# Patient Record
Sex: Male | Born: 1985
Health system: Southern US, Community
[De-identification: ages and names within clinical notes are randomized; demographics above are authoritative.]

## PROBLEM LIST (undated history)

## (undated) DIAGNOSIS — I10 Essential (primary) hypertension: Secondary | ICD-10-CM

## (undated) DIAGNOSIS — J45909 Unspecified asthma, uncomplicated: Secondary | ICD-10-CM

## (undated) HISTORY — DX: Unspecified asthma, uncomplicated: J45.909

## (undated) HISTORY — PX: KNEE ARTHROSCOPY: SUR90

## (undated) HISTORY — DX: Essential (primary) hypertension: I10

---
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
## 2013-10-22 DIAGNOSIS — I10 Essential (primary) hypertension: Secondary | ICD-10-CM | POA: Insufficient documentation

## 2015-02-10 DIAGNOSIS — J45909 Unspecified asthma, uncomplicated: Secondary | ICD-10-CM | POA: Insufficient documentation

## 2015-06-29 DIAGNOSIS — S83211A Bucket-handle tear of medial meniscus, current injury, right knee, initial encounter: Secondary | ICD-10-CM | POA: Insufficient documentation

## 2017-02-01 MED FILL — AMLODIPINE BESYLATE 5 MG TA: 5 | 90 days supply | Qty: 90 | Fill #0

## 2017-05-18 MED FILL — AMLODIPINE BESYLATE 5 MG TA: 5 | 90 days supply | Qty: 90 | Fill #1

## 2017-08-09 ENCOUNTER — Ambulatory Visit (INDEPENDENT_AMBULATORY_CARE_PROVIDER_SITE_OTHER): Payer: 59 | Admitting: Family Medicine

## 2017-08-09 ENCOUNTER — Encounter: Payer: Self-pay | Admitting: Family Medicine

## 2017-08-09 VITALS — BP 132/82 | HR 58 | Temp 98.6°F | Ht 66.0 in | Wt 189.2 lb

## 2017-08-09 DIAGNOSIS — I10 Essential (primary) hypertension: Secondary | ICD-10-CM

## 2017-08-09 DIAGNOSIS — Z13220 Encounter for screening for lipoid disorders: Secondary | ICD-10-CM

## 2017-08-09 DIAGNOSIS — Z114 Encounter for screening for human immunodeficiency virus [HIV]: Secondary | ICD-10-CM | POA: Diagnosis not present

## 2017-08-09 DIAGNOSIS — Z0001 Encounter for general adult medical examination with abnormal findings: Secondary | ICD-10-CM | POA: Diagnosis not present

## 2017-08-09 DIAGNOSIS — J4520 Mild intermittent asthma, uncomplicated: Secondary | ICD-10-CM | POA: Insufficient documentation

## 2017-08-09 DIAGNOSIS — Z1159 Encounter for screening for other viral diseases: Secondary | ICD-10-CM

## 2017-08-09 DIAGNOSIS — E669 Obesity, unspecified: Secondary | ICD-10-CM

## 2017-08-09 LAB — COMPREHENSIVE METABOLIC PANEL
ALT: 10 U/L (ref 0–53)
AST: 15 U/L (ref 0–37)
Albumin: 4.5 g/dL (ref 3.5–5.2)
Alkaline Phosphatase: 50 U/L (ref 39–117)
BUN: 13 mg/dL (ref 6–23)
CO2: 29 mEq/L (ref 19–32)
Calcium: 9.7 mg/dL (ref 8.4–10.5)
Chloride: 105 mEq/L (ref 96–112)
Creatinine, Ser: 0.98 mg/dL (ref 0.40–1.50)
GFR: 114.32 mL/min (ref >60.00)
Glucose, Bld: 93 mg/dL (ref 70–99)
Potassium: 4.1 mEq/L (ref 3.5–5.1)
Sodium: 139 mEq/L (ref 135–145)
Total Bilirubin: 0.6 mg/dL (ref 0.2–1.2)
Total Protein: 7.6 g/dL (ref 6.0–8.3)

## 2017-08-09 LAB — CBC
HCT: 44.9 % (ref 39.0–52.0)
Hemoglobin: 14.8 g/dL (ref 13.0–17.0)
MCHC: 32.9 g/dL (ref 30.0–36.0)
MCV: 86.7 fl (ref 78.0–100.0)
Platelets: 298 10*3/uL (ref 150.0–400.0)
RBC: 5.18 Mil/uL (ref 4.22–5.81)
RDW: 14.2 % (ref 11.5–15.5)
WBC: 2.7 10*3/uL — ABNORMAL LOW (ref 4.0–10.5)

## 2017-08-09 LAB — POCT GLYCOSYLATED HEMOGLOBIN (HGB A1C): Hemoglobin A1C: 5.3 % (ref 4.0–5.6)

## 2017-08-09 LAB — LIPID PANEL
Cholesterol: 159 mg/dL (ref 0–200)
HDL: 42.4 mg/dL (ref >39.00)
LDL Cholesterol: 107 mg/dL — ABNORMAL HIGH (ref 0–99)
NonHDL: 116.39
Total CHOL/HDL Ratio: 4
Triglycerides: 48 mg/dL (ref 0.0–149.0)
VLDL: 9.6 mg/dL (ref 0.0–40.0)

## 2017-08-09 MED ORDER — ALBUTEROL SULFATE HFA 108 (90 BASE) MCG/ACT IN AERS: 2.0000 | INHALATION_SPRAY | Freq: Four times a day (QID) | RESPIRATORY_TRACT | 0 refills | Status: DC | PRN

## 2017-08-09 MED ORDER — AMLODIPINE BESYLATE 5 MG PO TABS: 5.0000 mg | ORAL_TABLET | Freq: Every day | ORAL | 3 refills | Status: DC

## 2017-08-09 NOTE — Patient Instructions (Signed)
Preventive Care 18-39 Years, Male Preventive care refers to lifestyle choices and visits with your health care provider that can promote health and wellness. What does preventive care include?  A yearly physical exam. This is also called an annual well check.  Dental exams once or twice a year.  Routine eye exams. Ask your health care provider how often you should have your eyes checked.  Personal lifestyle choices, including: ? Daily care of your teeth and gums. ? Regular physical activity. ? Eating a healthy diet. ? Avoiding tobacco and drug use. ? Limiting alcohol use. ? Practicing safe sex. What happens during an annual well check? The services and screenings done by your health care provider during your annual well check will depend on your age, overall health, lifestyle risk factors, and family history of disease. Counseling Your health care provider may ask you questions about your:  Alcohol use.  Tobacco use.  Drug use.  Emotional well-being.  Home and relationship well-being.  Sexual activity.  Eating habits.  Work and work Statistician.  Screening You may have the following tests or measurements:  Height, weight, and BMI.  Blood pressure.  Lipid and cholesterol levels. These may be checked every 5 years starting at age 34.  Diabetes screening. This is done by checking your blood sugar (glucose) after you have not eaten for a while (fasting).  Skin check.  Hepatitis C blood test.  Hepatitis B blood test.  Sexually transmitted disease (STD) testing.  Discuss your test results, treatment options, and if necessary, the need for more tests with your health care provider. Vaccines Your health care provider may recommend certain vaccines, such as:  Influenza vaccine. This is recommended every year.  Tetanus, diphtheria, and acellular pertussis (Tdap, Td) vaccine. You may need a Td booster every 10 years.  Varicella vaccine. You may need this if you  have not been vaccinated.  HPV vaccine. If you are 23 or younger, you may need three doses over 6 months.  Measles, mumps, and rubella (MMR) vaccine. You may need at least one dose of MMR.You may also need a second dose.  Pneumococcal 13-valent conjugate (PCV13) vaccine. You may need this if you have certain conditions and have not been vaccinated.  Pneumococcal polysaccharide (PPSV23) vaccine. You may need one or two doses if you smoke cigarettes or if you have certain conditions.  Meningococcal vaccine. One dose is recommended if you are age 65-21 years and a first-year college student living in a residence hall, or if you have one of several medical conditions. You may also need additional booster doses.  Hepatitis A vaccine. You may need this if you have certain conditions or if you travel or work in places where you may be exposed to hepatitis A.  Hepatitis B vaccine. You may need this if you have certain conditions or if you travel or work in places where you may be exposed to hepatitis B.  Haemophilus influenzae type b (Hib) vaccine. You may need this if you have certain risk factors.  Talk to your health care provider about which screenings and vaccines you need and how often you need them. This information is not intended to replace advice given to you by your health care provider. Make sure you discuss any questions you have with your health care provider. Document Released: 03/29/2001 Document Revised: 10/21/2015 Document Reviewed: 12/02/2014 Elsevier Interactive Patient Education  [employer].

## 2017-08-09 NOTE — Assessment & Plan Note (Signed)
Stable. Sent in Rx for albuterol inhaler.

## 2017-08-09 NOTE — Assessment & Plan Note (Addendum)
Stable. Continue norvasc 5mg  daily.  Check CMET and CBC.

## 2017-08-09 NOTE — Progress Notes (Signed)
Subjective:  Max Weeks is a 32 y.o. male who presents today for his annual comprehensive physical exam.    HPI:  He has no acute complaints today.   Lifestyle Diet: No specific diets Exercise: Cardio and weights on off weeks.   Depression screen PHQ 2/9 08/09/2017  Decreased Interest 0  Down, Depressed, Hopeless 0  PHQ - 2 Score 0   Health Maintenance Due  Topic Date Due  . HIV Screening  01/18/2001  . TETANUS/TDAP  01/18/2005    ROS: Per HPI, otherwise a complete review of systems was negative.   PMH:  The following were reviewed and entered/updated in epic: Past Medical History:  Diagnosis Date  . Asthma   . Hypertension    Patient Active Problem List   Diagnosis Date Noted  . Essential hypertension 08/09/2017  . Mild intermittent asthma without complication 08/09/2017   Past Surgical History:  Procedure Laterality Date  . KNEE ARTHROSCOPY     meniscal tear - ju jitsu injury    Family History  Problem Relation Age of Onset  . Hypertension Mother   . Diabetes Maternal Grandmother   . Hypertension Maternal Grandmother   . Stroke Maternal Grandmother   . COPD Maternal Grandfather     Medications- reviewed and updated Current Outpatient Medications  Medication Sig Dispense Refill  . amLODipine (NORVASC) 5 MG tablet Take 1 tablet (5 mg total) by mouth daily. 90 tablet 3  . albuterol (PROVENTIL HFA;VENTOLIN HFA) 108 (90 Base) MCG/ACT inhaler Inhale 2 puffs into the lungs every 6 (six) hours as needed for wheezing or shortness of breath. 1 Inhaler 0   No current facility-administered medications for this visit.     Allergies-reviewed and updated No Known Allergies  Social History   Socioeconomic History  . Marital status: Single    Spouse name: Not on file  . Number of children: 0  . Years of education: Not on file  . Highest education level: Not on file  Occupational History  . Not on file  Social Needs  . Financial resource strain: Not on  file  . Food insecurity:    Worry: Not on file    Inability: Not on file  . Transportation needs:    Medical: Not on file    Non-medical: Not on file  Tobacco Use  . Smoking status: Never Smoker  . Smokeless tobacco: Never Used  Substance and Sexual Activity  . Alcohol use: Yes    Comment: Occasional  . Drug use: Never  . Sexual activity: Not on file  Lifestyle  . Physical activity:    Days per week: Not on file    Minutes per session: Not on file  . Stress: Not on file  Relationships  . Social connections:    Talks on phone: Not on file    Gets together: Not on file    Attends religious service: Not on file    Active member of club or organization: Not on file    Attends meetings of clubs or organizations: Not on file    Relationship status: Not on file  Other Topics Concern  . Not on file  Social History Narrative  . Not on file    Objective:  Physical Exam: BP 132/82 (BP Location: Left Arm, Patient Position: Sitting, Cuff Size: Normal)   Pulse (!) 58   Temp 98.6 F (37 C) (Oral)   Ht 5\' 6"  (1.676 m)   Wt 189 lb 3.2 oz (85.8 kg)  SpO2 97%   BMI 30.54 kg/m   Body mass index is 30.54 kg/m. Wt Readings from Last 3 Encounters:  08/09/17 189 lb 3.2 oz (85.8 kg)   Gen: NAD, resting comfortably HEENT: TMs normal bilaterally. OP clear. No thyromegaly noted.  CV: RRR with no murmurs appreciated Pulm: NWOB, CTAB with no crackles, wheezes, or rhonchi GI: Normal bowel sounds present. Soft, Nontender, Nondistended. MSK: no edema, cyanosis, or clubbing noted Skin: warm, dry Neuro: CN2-12 grossly intact. Strength 5/5 in upper and lower extremities. Reflexes symmetric and intact bilaterally.  Psych: Normal affect and thought content  Assessment/Plan:  Essential hypertension Stable. Continue norvasc 5mg  daily.  Check CMET and CBC.   Mild intermittent asthma without complication Stable. Sent in Rx for albuterol inhaler.   Preventative Healthcare: Check lipid  panel, screen for diabetes with A1c. Check HIV and hep C antibody.   Patient Counseling(The following topics were reviewed and/or handout was given):  -Nutrition: Stressed importance of moderation in sodium/caffeine intake, saturated fat and cholesterol, caloric balance, sufficient intake of fresh fruits, vegetables, and fiber.  -Stressed the importance of regular exercise.   -Substance Abuse: Discussed cessation/primary prevention of tobacco, alcohol, or other drug use; driving or other dangerous activities under the influence; availability of treatment for abuse.   -Injury prevention: Discussed safety belts, safety helmets, smoke detector, smoking near bedding or upholstery.   -Sexuality: Discussed sexually transmitted diseases, partner selection, use of condoms, avoidance of unintended pregnancy and contraceptive alternatives.   -Dental health: Discussed importance of regular tooth brushing, flossing, and dental visits.  -Health maintenance and immunizations reviewed. Please refer to Health maintenance section.  Return to care in 1 year for next preventative visit.   Katina Degree. Jimmey Ralph, MD 08/09/2017 9:45 AM

## 2017-08-10 LAB — HEPATITIS C ANTIBODY
Hepatitis C Ab: NONREACTIVE
SIGNAL TO CUT-OFF: 0.02 (ref <1.00)

## 2017-08-10 LAB — HIV ANTIBODY (ROUTINE TESTING W REFLEX): HIV 1&2 Ab, 4th Generation: NONREACTIVE

## 2017-08-10 NOTE — Progress Notes (Signed)
Dr Lavone NeriParker's interpretation of your lab work:  Your blood work was all normal. Keep up the good work!  If you have any additional questions, please give us a call or send us a message through [facility].  Take care, Dr Jimmey RalphParker

## 2017-09-07 MED FILL — AMLODIPINE BESYLATE 5 MG TA: 5 | 90 days supply | Qty: 90 | Fill #0

## 2017-09-07 MED FILL — VENTOLIN HFA 90 MCG INHALER: 108 (90 BAS | 25 days supply | Qty: 18 | Fill #0

## 2017-10-31 ENCOUNTER — Encounter: Payer: Self-pay | Admitting: Family Medicine

## 2017-11-02 ENCOUNTER — Ambulatory Visit (INDEPENDENT_AMBULATORY_CARE_PROVIDER_SITE_OTHER): Payer: 59

## 2017-11-02 DIAGNOSIS — Z23 Encounter for immunization: Secondary | ICD-10-CM | POA: Diagnosis not present

## 2017-11-02 NOTE — Progress Notes (Signed)
Per orders of Dr. Jimmey RalphParker , injection of HPV  given by Donnamarie PoagJoellen Y Thompson. Patient tolerated injection well. Patient given copy of VIS. Injection given in left deltoid. Will made app for HPV #2 in o1-2 months

## 2017-11-14 DIAGNOSIS — L905 Scar conditions and fibrosis of skin: Secondary | ICD-10-CM | POA: Diagnosis not present

## 2017-12-13 ENCOUNTER — Ambulatory Visit: Payer: 59

## 2017-12-18 ENCOUNTER — Ambulatory Visit: Payer: 59 | Admitting: Family Medicine

## 2017-12-18 ENCOUNTER — Other Ambulatory Visit (HOSPITAL_COMMUNITY)
Admission: RE | Admit: 2017-12-18 | Discharge: 2017-12-18 | Disposition: A | Discharge status: Home / Self Care | Payer: 59 | Source: Ambulatory Visit | Attending: Family Medicine | Admitting: Family Medicine

## 2017-12-18 ENCOUNTER — Encounter: Payer: Self-pay | Admitting: Family Medicine

## 2017-12-18 ENCOUNTER — Ambulatory Visit (INDEPENDENT_AMBULATORY_CARE_PROVIDER_SITE_OTHER): Payer: 59 | Admitting: Family Medicine

## 2017-12-18 VITALS — BP 130/8 | HR 71 | Temp 98.2°F | Ht 66.0 in | Wt 189.8 lb

## 2017-12-18 DIAGNOSIS — Z113 Encounter for screening for infections with a predominantly sexual mode of transmission: Secondary | ICD-10-CM | POA: Insufficient documentation

## 2017-12-18 DIAGNOSIS — J029 Acute pharyngitis, unspecified: Secondary | ICD-10-CM | POA: Diagnosis not present

## 2017-12-18 LAB — POCT RAPID STREP A (OFFICE): RAPID STREP A SCREEN: NEGATIVE

## 2017-12-18 MED ORDER — AMOXICILLIN 500 MG PO CAPS: 500.0000 mg | ORAL_CAPSULE | Freq: Two times a day (BID) | ORAL | 0 refills | Status: DC

## 2017-12-18 MED FILL — AMOXICILLIN 500 MG CAPSULE: 500 | 10 days supply | Qty: 20 | Fill #0

## 2017-12-18 NOTE — Progress Notes (Signed)
Patient: Max Weeks MRN: [id number] DOB: 05/31/1985 PCP: Ardith Dark, MD     Subjective:  Chief Complaint  Patient presents with  . Sore Throat  . right ear pressure    HPI: The patient is a 32 y.o. male who presents today for a sore throat and some right ear pain. He thinks his sore throat started a few weeks ago, but really has bothered him over the past couple of days. He at first had some congestion and then started to get a pain in his right ear. No fever/chills. No sinus/pain or pressure. No cough/shortness of breath. He has taken sudafed over the counter and claritin. No sick contacts that he can think of. 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   Also would like to be screened for STDs as he has had a new sexual partner.   Review of Systems  Constitutional: Negative for chills, fatigue and fever.  HENT: Positive for congestion, ear pain and sore throat. Negative for sinus pressure and sinus pain.        Right ear pressure  Respiratory: Negative for cough and shortness of breath.   Cardiovascular: Negative for chest pain.  Gastrointestinal: Negative for abdominal pain, nausea and vomiting.  Musculoskeletal: Negative for neck pain and neck stiffness.  Skin: Negative.   Neurological: Negative for dizziness and headaches.    Allergies Patient has No Known Allergies.  Past Medical History Patient  has a past medical history of Asthma and Hypertension.  Surgical History Patient  has a past surgical history that includes Knee arthroscopy.  Family History Pateint's family history includes COPD in his maternal grandfather; Diabetes in his maternal grandmother; Hypertension in his maternal grandmother and mother; Stroke in his maternal grandmother.  Social History Patient  reports that he has never smoked. He has never used smokeless tobacco. He reports that he drinks alcohol. He reports that he does not use drugs.    Objective: Vitals:   12/18/17 1340 12/18/17  1351  BP: 140/90 (!) 130/8  Pulse: 71   Temp: 98.2 F (36.8 C)   TempSrc: Oral   SpO2: 98%   Weight: 189 lb 12.8 oz (86.1 kg)   Height: 5\' 6"  (1.676 m)     Body mass index is 30.63 kg/m.  Physical Exam  Constitutional: He appears well-developed and well-nourished.  HENT:  Right Ear: Ear canal normal.  Left Ear: Tympanic membrane and ear canal normal.  Mouth/Throat: No oral lesions. Posterior oropharyngeal erythema present. Tonsils are 2+ on the right. Tonsils are 2+ on the left. Tonsillar exudate.  Left TM pearly with light reflex.  Right TM impacted with cerumen. Unable to lavage out.   Eyes: Pupils are equal, round, and reactive to light. EOM are normal.  Cardiovascular: Normal rate, regular rhythm and normal heart sounds.  Pulmonary/Chest: Effort normal and breath sounds normal.  Abdominal: Soft. Bowel sounds are normal.  Lymphadenopathy:    He has cervical adenopathy.  Neurological: He is alert.  Vitals reviewed.     rapid strep: negative   Assessment/plan: 1. Sore throat Tonsils very angry with exudate and look classic for strep. He has 3/4 centor criteria and will treat for strep. 10 day course of amoxicillin. Throw away toothbrush after 24 hours, no work x 24 hours. Ibuprofen, warm salt water gurgles. Let me know if not getting better. Will also culture for gc/c with new sexual partner.  - POCT rapid strep A - Cytology (oral, anal, urethral) ancillary only; Future  2. Screening  for STD (sexually transmitted disease)  - Urine cytology ancillary only; Future - HIV Antibody (routine testing w rflx) - RPR  -debrox over the counter to help with wax. Return prn for removal.    Return if symptoms worsen or fail to improve.    Orland Mustard, MD [facility]   12/18/2017

## 2017-12-19 LAB — CYTOLOGY, (ORAL, ANAL, URETHRAL) ANCILLARY ONLY
Chlamydia: NEGATIVE
Neisseria Gonorrhea: NEGATIVE

## 2017-12-19 LAB — URINE CYTOLOGY ANCILLARY ONLY
CHLAMYDIA, DNA PROBE: NEGATIVE
Neisseria Gonorrhea: NEGATIVE
Trichomonas: NEGATIVE

## 2017-12-19 LAB — HIV ANTIBODY (ROUTINE TESTING W REFLEX): HIV: NONREACTIVE

## 2017-12-19 LAB — RPR: RPR: NONREACTIVE

## 2018-01-01 ENCOUNTER — Other Ambulatory Visit (HOSPITAL_COMMUNITY)
Admission: RE | Admit: 2018-01-01 | Discharge: 2018-01-01 | Disposition: A | Discharge status: Home / Self Care | Payer: 59 | Source: Ambulatory Visit | Attending: Family Medicine | Admitting: Family Medicine

## 2018-01-01 ENCOUNTER — Ambulatory Visit (INDEPENDENT_AMBULATORY_CARE_PROVIDER_SITE_OTHER): Payer: 59 | Admitting: Family Medicine

## 2018-01-01 ENCOUNTER — Encounter: Payer: Self-pay | Admitting: Family Medicine

## 2018-01-01 VITALS — BP 122/72 | HR 62 | Temp 98.4°F | Ht 66.0 in | Wt 194.2 lb

## 2018-01-01 DIAGNOSIS — J029 Acute pharyngitis, unspecified: Secondary | ICD-10-CM | POA: Diagnosis not present

## 2018-01-01 NOTE — Progress Notes (Signed)
Patient: Max Weeks MRN: [id number] DOB: [birth year] PCP: Ardith Dark, MD     Subjective:  Chief Complaint  Patient presents with  . Sore Throat    HPI: The patient is a 32 y.o. male who presents today for sore throat. I saw him on 12/18/17 and treated for strep with 10 day course of amoxicillin. Cultures were negative. He states he got better and then this past Friday he started to feel really bad again. Symptoms improved Saturday and Sunday. 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 No exudate, but looks raw to him. He also has some post nasal drip and has been using flonase and claritin D for. He feels good today. Pain in his throat is still there. He has had no fevers or chills. He has no swollen lymph nodes and no known sick contacts, but does work in the hospital as an Administrator, arts.   He recalls possibly burning his throat with a hot latte right before he saw me on 12/18/2017.   Review of Systems  Constitutional: Negative for chills, fatigue and fever.  HENT: Positive for congestion, postnasal drip and sore throat. Negative for rhinorrhea.   Respiratory: Negative for cough and shortness of breath.   Cardiovascular: Negative for chest pain.  Gastrointestinal: Negative for abdominal pain and nausea.  Musculoskeletal: Negative for neck pain and neck stiffness.  Neurological: Negative for dizziness and headaches.    Allergies Patient has No Known Allergies.  Past Medical History Patient  has a past medical history of Asthma and Hypertension.  Surgical History Patient  has a past surgical history that includes Knee arthroscopy.  Family History Pateint's family history includes COPD in his maternal grandfather; Diabetes in his maternal grandmother; Hypertension in his maternal grandmother and mother; Stroke in his maternal grandmother.  Social History Patient  reports that he has never smoked. He has never used smokeless tobacco. He reports that he drinks alcohol. He  reports that he does not use drugs.    Objective: Vitals:   01/01/18 0812  BP: 122/72  Pulse: 62  Temp: 98.4 F (36.9 C)  TempSrc: Oral  SpO2: 98%  Weight: 194 lb 3.2 oz (88.1 kg)  Height: 5\' 6"  (1.676 m)    Body mass index is 31.34 kg/m.  Physical Exam  Constitutional: He appears well-developed and well-nourished.  HENT:  Right Ear: Tympanic membrane normal.  Left Ear: Tympanic membrane normal.  Mouth/Throat: Tonsils are 2+ on the right. Tonsils are 2+ on the left. No tonsillar exudate.  No tonsillar exudate, but right tonsil has what appears to be small tonsoliths, is enlarged and erythematous and injected. No vesicles or ulcerated lesions.   Neck: Normal range of motion. Neck supple.  Cardiovascular: Normal rate, regular rhythm and normal heart sounds.  Pulmonary/Chest: Effort normal and breath sounds normal.  Abdominal: Soft. Bowel sounds are normal.  Lymphadenopathy:    He has no cervical adenopathy.  Skin: Skin is warm and dry. No rash noted.  Vitals reviewed.      Assessment/plan: 1. Sore throat Already treated for tonsillitis/strep on 11/4. Will culture his throat today and he is requesting hsv swab. Will also check for candida. Continue to treat post nasal drip. If not better, we will send to ENT and he will let me know.  - Culture, Group A Strep - Cytology (oral, anal, urethral) ancillary only; Future - Cytology (oral, anal, urethral) ancillary only      Return if symptoms worsen or fail to  improve.   Orland MustardAllison Ladye Macnaughton, MD [facility]   01/01/2018

## 2018-01-02 ENCOUNTER — Telehealth: Payer: Self-pay | Admitting: Family Medicine

## 2018-01-02 LAB — CYTOLOGY, (ORAL, ANAL, URETHRAL) ANCILLARY ONLY: CANDIDA VAGINITIS: NEGATIVE

## 2018-01-02 NOTE — Telephone Encounter (Signed)
Copied from CRM [id number]. Topic: Quick Communication - See Telephone Encounter >> Jan 02, 2018 10:04 AM Windy KalataMichael, [name], NT wrote: CRM for notification. See Telephone encounter for: 01/02/18.  [name] is calling from Cytology and states that they can not perform a test that they have ordered for the swab. She would like a call back.  Cb# [phone]

## 2018-01-02 NOTE — Telephone Encounter (Signed)
[facility] [place] [facility] [facility].  Can not perform herpes culture off of swab provided.  [name] will check with Trey PaulaJeff in Microbiology to see if test can still be performed or if a new specimen needs to be submitted.  Trey PaulaJeff will contact me back tomorrow 11/20 with an answer.

## 2018-01-02 NOTE — Telephone Encounter (Signed)
Dr. Artis FlockWolfe saw this patient on 01/01/2018.

## 2018-01-03 LAB — CULTURE, GROUP A STREP
MICRO NUMBER: 91386116
SPECIMEN QUALITY:: ADEQUATE

## 2018-01-10 ENCOUNTER — Encounter: Payer: Self-pay | Admitting: Family Medicine

## 2018-01-15 MED FILL — AMLODIPINE BESYLATE 5 MG TA: 5 | 90 days supply | Qty: 90 | Fill #1

## 2018-05-03 MED FILL — AMLODIPINE BESYLATE 5 MG TA: 5 | 90 days supply | Qty: 90 | Fill #2

## 2018-05-07 ENCOUNTER — Other Ambulatory Visit: Payer: Self-pay | Admitting: Family Medicine

## 2018-05-07 MED ORDER — ALBUTEROL SULFATE HFA 108 (90 BASE) MCG/ACT IN AERS: 2.0000 | INHALATION_SPRAY | Freq: Four times a day (QID) | RESPIRATORY_TRACT | 0 refills | Status: DC | PRN

## 2018-05-08 MED FILL — ALBUTEROL SULFATE HFA 108 (: 108 (90 BAS | 25 days supply | Qty: 18 | Fill #0

## 2018-08-13 ENCOUNTER — Encounter: Payer: Self-pay | Admitting: Family Medicine

## 2018-08-13 ENCOUNTER — Other Ambulatory Visit: Payer: Self-pay

## 2018-08-13 ENCOUNTER — Other Ambulatory Visit (HOSPITAL_COMMUNITY)
Admission: RE | Admit: 2018-08-13 | Discharge: 2018-08-13 | Disposition: A | Discharge status: Home / Self Care | Payer: 59 | Source: Ambulatory Visit | Attending: Family Medicine | Admitting: Family Medicine

## 2018-08-13 ENCOUNTER — Ambulatory Visit (INDEPENDENT_AMBULATORY_CARE_PROVIDER_SITE_OTHER): Payer: 59 | Admitting: Family Medicine

## 2018-08-13 VITALS — BP 110/68 | HR 69 | Temp 98.6°F | Ht 66.0 in | Wt 183.4 lb

## 2018-08-13 DIAGNOSIS — I10 Essential (primary) hypertension: Secondary | ICD-10-CM | POA: Diagnosis not present

## 2018-08-13 DIAGNOSIS — Z0001 Encounter for general adult medical examination with abnormal findings: Secondary | ICD-10-CM | POA: Diagnosis not present

## 2018-08-13 DIAGNOSIS — Z0101 Encounter for examination of eyes and vision with abnormal findings: Secondary | ICD-10-CM | POA: Diagnosis not present

## 2018-08-13 DIAGNOSIS — J4520 Mild intermittent asthma, uncomplicated: Secondary | ICD-10-CM

## 2018-08-13 LAB — BASIC METABOLIC PANEL
BUN: 12 mg/dL (ref 6–23)
CO2: 27 mEq/L (ref 19–32)
Calcium: 9.5 mg/dL (ref 8.4–10.5)
Chloride: 104 mEq/L (ref 96–112)
Creatinine, Ser: 1.1 mg/dL (ref 0.40–1.50)
GFR: 93.53 mL/min (ref >60.00)
Glucose, Bld: 75 mg/dL (ref 70–99)
Potassium: 4 mEq/L (ref 3.5–5.1)
Sodium: 139 mEq/L (ref 135–145)

## 2018-08-13 LAB — LIPID PANEL
Cholesterol: 156 mg/dL (ref 0–200)
HDL: 44.6 mg/dL (ref >39.00)
LDL Cholesterol: 103 mg/dL — ABNORMAL HIGH (ref 0–99)
NonHDL: 111.37
Total CHOL/HDL Ratio: 3
Triglycerides: 42 mg/dL (ref 0.0–149.0)
VLDL: 8.4 mg/dL (ref 0.0–40.0)

## 2018-08-13 LAB — HEMOGLOBIN A1C: Hgb A1c MFr Bld: 5.4 % (ref 4.6–6.5)

## 2018-08-13 MED ORDER — AMLODIPINE BESYLATE 5 MG PO TABS: 5.0000 mg | ORAL_TABLET | Freq: Every day | ORAL | 3 refills | Status: DC

## 2018-08-13 MED FILL — AMLODIPINE BESYLATE 5 MG TA: 5 | 90 days supply | Qty: 90 | Fill #0

## 2018-08-13 NOTE — Assessment & Plan Note (Signed)
Stable. Continue albuterol as needed.  

## 2018-08-13 NOTE — Patient Instructions (Addendum)
It was very nice to see you today!  We will check blood work today and a urine sample.  I will refill your medications.    Eat at least 3 REAL meals and 1-2 snacks per day.  Aim for no more than 5 hours between eating.  Eat breakfast within one hour of getting up.    Obtain twice as many fruits/vegetables as protein or carbohydrate foods for both lunch and dinner.   Cut down on sweet beverages. This includes juice, soda, and sweet tea.    Exercise at least 150 minutes every week.   Take care, Dr Jerline Pain  Preventive Care 91-51 Years Old, Male Preventive care refers to lifestyle choices and visits with your health care provider that can promote health and wellness. This includes:  A yearly physical exam. This is also called an annual well check.  Regular dental and eye exams.  Immunizations.  Screening for certain conditions.  Healthy lifestyle choices, such as eating a healthy diet, getting regular exercise, not using drugs or products that contain nicotine and tobacco, and limiting alcohol use. What can I expect for my preventive care visit? Physical exam Your health care provider will check:  Height and weight. These may be used to calculate body mass index (BMI), which is a measurement that tells if you are at a healthy weight.  Heart rate and blood pressure.  Your skin for abnormal spots. Counseling Your health care provider may ask you questions about:  Alcohol, tobacco, and drug use.  Emotional well-being.  Home and relationship well-being.  Sexual activity.  Eating habits.  Work and work Statistician. What immunizations do I need?  Influenza (flu) vaccine  This is recommended every year. Tetanus, diphtheria, and pertussis (Tdap) vaccine  You may need a Td booster every 10 years. Varicella (chickenpox) vaccine  You may need this vaccine if you have not already been vaccinated. Human papillomavirus (HPV) vaccine  If recommended by your health  care provider, you may need three doses over 6 months. Measles, mumps, and rubella (MMR) vaccine  You may need at least one dose of MMR. You may also need a second dose. Meningococcal conjugate (MenACWY) vaccine  One dose is recommended if you are 29-75 years old and a Market researcher living in a residence hall, or if you have one of several medical conditions. You may also need additional booster doses. Pneumococcal conjugate (PCV13) vaccine  You may need this if you have certain conditions and were not previously vaccinated. Pneumococcal polysaccharide (PPSV23) vaccine  You may need one or two doses if you smoke cigarettes or if you have certain conditions. Hepatitis A vaccine  You may need this if you have certain conditions or if you travel or work in places where you may be exposed to hepatitis A. Hepatitis B vaccine  You may need this if you have certain conditions or if you travel or work in places where you may be exposed to hepatitis B. Haemophilus influenzae type b (Hib) vaccine  You may need this if you have certain risk factors. You may receive vaccines as individual doses or as more than one vaccine together in one shot (combination vaccines). Talk with your health care provider about the risks and benefits of combination vaccines. What tests do I need? Blood tests  Lipid and cholesterol levels. These may be checked every 5 years starting at age 74.  Hepatitis C test.  Hepatitis B test. Screening   Diabetes screening. This is done by checking  your blood sugar (glucose) after you have not eaten for a while (fasting).  Sexually transmitted disease (STD) testing. Talk with your health care provider about your test results, treatment options, and if necessary, the need for more tests. Follow these instructions at home: Eating and drinking   Eat a diet that includes fresh fruits and vegetables, whole grains, lean protein, and low-fat dairy products.   Take vitamin and mineral supplements as recommended by your health care provider.  Do not drink alcohol if your health care provider tells you not to drink.  If you drink alcohol: ? Limit how much you have to 0-2 drinks a day. ? Be aware of how much alcohol is in your drink. In the [place] one drink equals one 12 oz bottle of beer (355 mL), one 5 oz glass of wine (148 mL), or one 1 oz glass of hard liquor (44 mL). Lifestyle  Take daily care of your teeth and gums.  Stay active. Exercise for at least 30 minutes on 5 or more days each week.  Do not use any products that contain nicotine or tobacco, such as cigarettes, e-cigarettes, and chewing tobacco. If you need help quitting, ask your health care provider.  If you are sexually active, practice safe sex. Use a condom or other form of protection to prevent STIs (sexually transmitted infections). What's next?  Go to your health care provider once a year for a well check visit.  Ask your health care provider how often you should have your eyes and teeth checked.  Stay up to date on all vaccines. This information is not intended to replace advice given to you by your health care provider. Make sure you discuss any questions you have with your health care provider. Document Released: 03/29/2001 Document Revised: 01/25/2018 Document Reviewed: 01/25/2018 Elsevier Patient Education  2020 [employer]

## 2018-08-13 NOTE — Assessment & Plan Note (Signed)
At goal.  Continue amlodipine 5 mg daily. 

## 2018-08-13 NOTE — Progress Notes (Signed)
Chief Complaint:  Max Weeks is a 33 y.o. male who presents today for his annual comprehensive physical exam.    Assessment/Plan:  Mild intermittent asthma without complication Stable.  Continue albuterol as needed.  Essential hypertension At goal.  Continue amlodipine 5 mg daily.  BMI 29 Discussed lifestyle modifications.   Preventative Healthcare: Check lipid panel, A1c, HIV, RPR, hep C and urine cytology.   Patient Counseling(The following topics were reviewed and/or handout was given):  -Nutrition: Stressed importance of moderation in sodium/caffeine intake, saturated fat and cholesterol, caloric balance, sufficient intake of fresh fruits, vegetables, and fiber.  -Stressed the importance of regular exercise.   -Substance Abuse: Discussed cessation/primary prevention of tobacco, alcohol, or other drug use; driving or other dangerous activities under the influence; availability of treatment for abuse.   -Injury prevention: Discussed safety belts, safety helmets, smoke detector, smoking near bedding or upholstery.   -Sexuality: Discussed sexually transmitted diseases, partner selection, use of condoms, avoidance of unintended pregnancy and contraceptive alternatives.   -Dental health: Discussed importance of regular tooth brushing, flossing, and dental visits.  -Health maintenance and immunizations reviewed. Please refer to Health maintenance section.  Return to care in 1 year for next preventative visit.     Subjective:  HPI:  He has no acute complaints today.   His stable, chronic medical conditions are outlined below:  # Essential Hypertension - On norvasc 5mg  daily and tolerating well - Home BPs: 130s/80s - ROS: No reported chest pain or shortness of breath  # Asthma - Takes albuterol as needed  Lifestyle Diet: No specific diets. Working with [employer].  Exercise: Cardio and weights on off weeks.   Depression screen PHQ 2/9 08/13/2018  Decreased  Interest 0  Down, Depressed, Hopeless 0  PHQ - 2 Score 0   There are no preventive care reminders to display for this patient.   ROS: Per HPI, otherwise a complete review of systems was negative.   PMH:  The following were reviewed and entered/updated in epic: Past Medical History:  Diagnosis Date  . Asthma   . Hypertension    Patient Active Problem List   Diagnosis Date Noted  . Essential hypertension 08/09/2017  . Mild intermittent asthma without complication 26/83/4196   Past Surgical History:  Procedure Laterality Date  . KNEE ARTHROSCOPY     meniscal tear - ju jitsu injury    Family History  Problem Relation Age of Onset  . Hypertension Mother   . Diabetes Maternal Grandmother   . Hypertension Maternal Grandmother   . Stroke Maternal Grandmother   . COPD Maternal Grandfather     Medications- reviewed and updated Current Outpatient Medications  Medication Sig Dispense Refill  . albuterol (PROVENTIL HFA;VENTOLIN HFA) 108 (90 Base) MCG/ACT inhaler Inhale 2 puffs into the lungs every 6 (six) hours as needed for wheezing or shortness of breath. 1 Inhaler 0  . amLODipine (NORVASC) 5 MG tablet Take 1 tablet (5 mg total) by mouth daily. 90 tablet 3   No current facility-administered medications for this visit.     Allergies-reviewed and updated No Known Allergies  Social History   Socioeconomic History  . Marital status: Single    Spouse name: Not on file  . Number of children: 0  . Years of education: Not on file  . Highest education level: Not on file  Occupational History  . Not on file  Social Needs  . Financial resource strain: Not on file  . Food insecurity  Worry: Not on file    Inability: Not on file  . Transportation needs    Medical: Not on file    Non-medical: Not on file  Tobacco Use  . Smoking status: Never Smoker  . Smokeless tobacco: Never Used  Substance and Sexual Activity  . Alcohol use: Yes    Comment: Occasional  . Drug use:  Never  . Sexual activity: Not on file  Lifestyle  . Physical activity    Days per week: Not on file    Minutes per session: Not on file  . Stress: Not on file  Relationships  . Social Musicianconnections    Talks on phone: Not on file    Gets together: Not on file    Attends religious service: Not on file    Active member of club or organization: Not on file    Attends meetings of clubs or organizations: Not on file    Relationship status: Not on file  Other Topics Concern  . Not on file  Social History Narrative  . Not on file        Objective:  Physical Exam: BP 110/68 (BP Location: Left Arm, Patient Position: Sitting, Cuff Size: Normal)   Pulse 69   Temp 98.6 F (37 C) (Oral)   Ht 5\' 6"  (1.676 m)   Wt 183 lb 6.1 oz (83.2 kg)   SpO2 98%   BMI 29.60 kg/m   Body mass index is 29.6 kg/m. Wt Readings from Last 3 Encounters:  08/13/18 183 lb 6.1 oz (83.2 kg)  01/01/18 194 lb 3.2 oz (88.1 kg)  12/18/17 189 lb 12.8 oz (86.1 kg)   Gen: NAD, resting comfortably HEENT: TMs normal bilaterally. OP clear. No thyromegaly noted.  CV: RRR with no murmurs appreciated Pulm: NWOB, CTAB with no crackles, wheezes, or rhonchi GI: Normal bowel sounds present. Soft, Nontender, Nondistended. MSK: no edema, cyanosis, or clubbing noted Skin: warm, dry Neuro: CN2-12 grossly intact. Strength 5/5 in upper and lower extremities. Reflexes symmetric and intact bilaterally.  Psych: Normal affect and thought content     Max Weeks M. Jimmey RalphParker, MD 08/13/2018 9:54 AM

## 2018-08-14 LAB — URINE CYTOLOGY ANCILLARY ONLY
Chlamydia: NEGATIVE
Neisseria Gonorrhea: NEGATIVE
Trichomonas: NEGATIVE

## 2018-08-14 LAB — HEPATITIS C ANTIBODY
Hepatitis C Ab: NONREACTIVE
SIGNAL TO CUT-OFF: 0.02 (ref <1.00)

## 2018-08-14 LAB — HIV ANTIBODY (ROUTINE TESTING W REFLEX): HIV 1&2 Ab, 4th Generation: NONREACTIVE

## 2018-08-14 LAB — RPR: RPR Ser Ql: NONREACTIVE

## 2018-08-15 ENCOUNTER — Encounter: Payer: Self-pay | Admitting: Family Medicine

## 2018-08-15 NOTE — Progress Notes (Signed)
Dr Marigene Ehlers interpretation of your lab work:  LDL a bit better, everything else looks good!   If you have any additional questions, please give [place] a call or send [place] a message through [place].  Take care, Dr Jerline Pain

## 2018-09-07 MED FILL — AMLODIPINE BESYLATE 5 MG TA: 5 | 90 days supply | Qty: 90 | Fill #0

## 2018-10-10 ENCOUNTER — Encounter: Payer: Self-pay | Admitting: Family Medicine

## 2018-10-11 ENCOUNTER — Other Ambulatory Visit: Payer: Self-pay

## 2018-10-11 DIAGNOSIS — R5383 Other fatigue: Secondary | ICD-10-CM

## 2018-10-11 NOTE — Telephone Encounter (Signed)
Please advise about vitamin D level.

## 2018-10-18 ENCOUNTER — Other Ambulatory Visit: Payer: Self-pay

## 2018-10-18 ENCOUNTER — Other Ambulatory Visit (INDEPENDENT_AMBULATORY_CARE_PROVIDER_SITE_OTHER): Payer: 59

## 2018-10-18 ENCOUNTER — Ambulatory Visit (INDEPENDENT_AMBULATORY_CARE_PROVIDER_SITE_OTHER): Payer: 59

## 2018-10-18 DIAGNOSIS — R5383 Other fatigue: Secondary | ICD-10-CM

## 2018-10-18 DIAGNOSIS — Z23 Encounter for immunization: Secondary | ICD-10-CM

## 2018-10-18 LAB — VITAMIN D 25 HYDROXY (VIT D DEFICIENCY, FRACTURES): VITD: 31.49 ng/mL (ref 30.00–100.00)

## 2018-10-18 NOTE — Progress Notes (Signed)
Dr Marigene Ehlers interpretation of your lab work:  Vitamin D just above lower range of normal. May not be a bad idea to start a supplement during the winter months or I can send in a prescription for 50000IU weekly. Let us know which you prefer!   If you have any additional questions, please give [place] a call or send [place] a message through [place].  Take care, Dr Jerline Pain

## 2019-01-14 MED FILL — AMLODIPINE BESYLATE 5 MG TA: 5 | 90 days supply | Qty: 90 | Fill #1

## 2019-03-20 DIAGNOSIS — F4322 Adjustment disorder with anxiety: Secondary | ICD-10-CM | POA: Diagnosis not present

## 2019-03-26 DIAGNOSIS — F4322 Adjustment disorder with anxiety: Secondary | ICD-10-CM | POA: Diagnosis not present

## 2019-04-03 DIAGNOSIS — F4322 Adjustment disorder with anxiety: Secondary | ICD-10-CM | POA: Diagnosis not present

## 2019-04-09 DIAGNOSIS — F4322 Adjustment disorder with anxiety: Secondary | ICD-10-CM | POA: Diagnosis not present

## 2019-04-17 DIAGNOSIS — F4322 Adjustment disorder with anxiety: Secondary | ICD-10-CM | POA: Diagnosis not present

## 2019-04-23 DIAGNOSIS — F4322 Adjustment disorder with anxiety: Secondary | ICD-10-CM | POA: Diagnosis not present

## 2019-05-07 DIAGNOSIS — F4322 Adjustment disorder with anxiety: Secondary | ICD-10-CM | POA: Diagnosis not present

## 2019-05-29 DIAGNOSIS — F4322 Adjustment disorder with anxiety: Secondary | ICD-10-CM | POA: Diagnosis not present

## 2019-06-12 DIAGNOSIS — F4322 Adjustment disorder with anxiety: Secondary | ICD-10-CM | POA: Diagnosis not present

## 2019-06-12 MED FILL — AMLODIPINE BESYLATE 5 MG TA: 5 | 90 days supply | Qty: 90 | Fill #2

## 2019-06-18 DIAGNOSIS — F4322 Adjustment disorder with anxiety: Secondary | ICD-10-CM | POA: Diagnosis not present

## 2019-06-26 DIAGNOSIS — F4322 Adjustment disorder with anxiety: Secondary | ICD-10-CM | POA: Diagnosis not present

## 2019-07-12 DIAGNOSIS — F4322 Adjustment disorder with anxiety: Secondary | ICD-10-CM | POA: Diagnosis not present

## 2019-07-24 DIAGNOSIS — F4322 Adjustment disorder with anxiety: Secondary | ICD-10-CM | POA: Diagnosis not present

## 2019-07-29 DIAGNOSIS — F4322 Adjustment disorder with anxiety: Secondary | ICD-10-CM | POA: Diagnosis not present

## 2019-08-07 DIAGNOSIS — F4322 Adjustment disorder with anxiety: Secondary | ICD-10-CM | POA: Diagnosis not present

## 2019-08-12 DIAGNOSIS — F4322 Adjustment disorder with anxiety: Secondary | ICD-10-CM | POA: Diagnosis not present

## 2019-08-21 DIAGNOSIS — F4322 Adjustment disorder with anxiety: Secondary | ICD-10-CM | POA: Diagnosis not present

## 2019-09-04 DIAGNOSIS — F4322 Adjustment disorder with anxiety: Secondary | ICD-10-CM | POA: Diagnosis not present

## 2019-09-10 DIAGNOSIS — F4322 Adjustment disorder with anxiety: Secondary | ICD-10-CM | POA: Diagnosis not present

## 2019-09-18 ENCOUNTER — Other Ambulatory Visit: Payer: Self-pay

## 2019-09-18 ENCOUNTER — Encounter: Payer: Self-pay | Admitting: Family Medicine

## 2019-09-18 ENCOUNTER — Ambulatory Visit (INDEPENDENT_AMBULATORY_CARE_PROVIDER_SITE_OTHER): Payer: 59 | Admitting: Family Medicine

## 2019-09-18 ENCOUNTER — Other Ambulatory Visit (HOSPITAL_COMMUNITY)
Admission: RE | Admit: 2019-09-18 | Discharge: 2019-09-18 | Disposition: A | Discharge status: Home / Self Care | Payer: 59 | Source: Ambulatory Visit | Attending: Family Medicine | Admitting: Family Medicine

## 2019-09-18 VITALS — BP 111/73 | HR 59 | Temp 97.4°F | Ht 66.0 in | Wt 177.0 lb

## 2019-09-18 DIAGNOSIS — I10 Essential (primary) hypertension: Secondary | ICD-10-CM | POA: Diagnosis not present

## 2019-09-18 DIAGNOSIS — E7800 Pure hypercholesterolemia, unspecified: Secondary | ICD-10-CM

## 2019-09-18 DIAGNOSIS — Z0001 Encounter for general adult medical examination with abnormal findings: Secondary | ICD-10-CM | POA: Diagnosis not present

## 2019-09-18 DIAGNOSIS — Z23 Encounter for immunization: Secondary | ICD-10-CM | POA: Diagnosis not present

## 2019-09-18 DIAGNOSIS — J4520 Mild intermittent asthma, uncomplicated: Secondary | ICD-10-CM | POA: Diagnosis not present

## 2019-09-18 DIAGNOSIS — F4322 Adjustment disorder with anxiety: Secondary | ICD-10-CM | POA: Diagnosis not present

## 2019-09-18 DIAGNOSIS — Z113 Encounter for screening for infections with a predominantly sexual mode of transmission: Secondary | ICD-10-CM

## 2019-09-18 MED ORDER — AMLODIPINE BESYLATE 5 MG PO TABS: 5.0000 mg | ORAL_TABLET | Freq: Every day | ORAL | 3 refills | Status: DC

## 2019-09-18 MED ORDER — ALBUTEROL SULFATE HFA 108 (90 BASE) MCG/ACT IN AERS: 2.0000 | INHALATION_SPRAY | Freq: Four times a day (QID) | RESPIRATORY_TRACT | 0 refills | Status: DC | PRN

## 2019-09-18 MED FILL — ALBUTEROL SULFATE HFA 108 (: 108 (90 BAS | 25 days supply | Qty: 18 | Fill #0

## 2019-09-18 MED FILL — AMLODIPINE BESYLATE 5 MG TA: 5 | 90 days supply | Qty: 90 | Fill #0

## 2019-09-18 NOTE — Assessment & Plan Note (Signed)
Stable.  Continue amlodipine 5 mg daily.  Check CBC, CMET, TSH, lipid panel.

## 2019-09-18 NOTE — Patient Instructions (Signed)
It was very nice to see you today!  Keep up the good work!  We will check blood work and urine sample today.  We will give you your final dose of HPV today.  I will refill your medications.  Come ask me in a year for your next physical.  Come back to me sooner if needed.  Take care, Dr Jerline Pain  Please try these tips to maintain a healthy lifestyle:   Eat at least 3 REAL meals and 1-2 snacks per day.  Aim for no more than 5 hours between eating.  If you eat breakfast, please do so within one hour of getting up.    Each meal should contain half fruits/vegetables, one quarter protein, and one quarter carbs (no bigger than a computer mouse)   Cut down on sweet beverages. This includes juice, soda, and sweet tea.     Drink at least 1 glass of water with each meal and aim for at least 8 glasses per day   Exercise at least 150 minutes every week.    Preventive Care 47-42 Years Old, Male Preventive care refers to lifestyle choices and visits with your health care provider that can promote health and wellness. This includes:  A yearly physical exam. This is also called an annual well check.  Regular dental and eye exams.  Immunizations.  Screening for certain conditions.  Healthy lifestyle choices, such as eating a healthy diet, getting regular exercise, not using drugs or products that contain nicotine and tobacco, and limiting alcohol use. What can I expect for my preventive care visit? Physical exam Your health care provider will check:  Height and weight. These may be used to calculate body mass index (BMI), which is a measurement that tells if you are at a healthy weight.  Heart rate and blood pressure.  Your skin for abnormal spots. Counseling Your health care provider may ask you questions about:  Alcohol, tobacco, and drug use.  Emotional well-being.  Home and relationship well-being.  Sexual activity.  Eating habits.  Work and work Statistician. What  immunizations do I need?  Influenza (flu) vaccine  This is recommended every year. Tetanus, diphtheria, and pertussis (Tdap) vaccine  You may need a Td booster every 10 years. Varicella (chickenpox) vaccine  You may need this vaccine if you have not already been vaccinated. Human papillomavirus (HPV) vaccine  If recommended by your health care provider, you may need three doses over 6 months. Measles, mumps, and rubella (MMR) vaccine  You may need at least one dose of MMR. You may also need a second dose. Meningococcal conjugate (MenACWY) vaccine  One dose is recommended if you are 44-71 years old and a Market researcher living in a residence hall, or if you have one of several medical conditions. You may also need additional booster doses. Pneumococcal conjugate (PCV13) vaccine  You may need this if you have certain conditions and were not previously vaccinated. Pneumococcal polysaccharide (PPSV23) vaccine  You may need one or two doses if you smoke cigarettes or if you have certain conditions. Hepatitis A vaccine  You may need this if you have certain conditions or if you travel or work in places where you may be exposed to hepatitis A. Hepatitis B vaccine  You may need this if you have certain conditions or if you travel or work in places where you may be exposed to hepatitis B. Haemophilus influenzae type b (Hib) vaccine  You may need this if you have certain  risk factors. You may receive vaccines as individual doses or as more than one vaccine together in one shot (combination vaccines). Talk with your health care provider about the risks and benefits of combination vaccines. What tests do I need? Blood tests  Lipid and cholesterol levels. These may be checked every 5 years starting at age 22.  Hepatitis C test.  Hepatitis B test. Screening   Diabetes screening. This is done by checking your blood sugar (glucose) after you have not eaten for a while  (fasting).  Sexually transmitted disease (STD) testing. Talk with your health care provider about your test results, treatment options, and if necessary, the need for more tests. Follow these instructions at home: Eating and drinking   Eat a diet that includes fresh fruits and vegetables, whole grains, lean protein, and low-fat dairy products.  Take vitamin and mineral supplements as recommended by your health care provider.  Do not drink alcohol if your health care provider tells you not to drink.  If you drink alcohol: ? Limit how much you have to 0-2 drinks a day. ? Be aware of how much alcohol is in your drink. In the [place] one drink equals one 12 oz bottle of beer (355 mL), one 5 oz glass of wine (148 mL), or one 1 oz glass of hard liquor (44 mL). Lifestyle  Take daily care of your teeth and gums.  Stay active. Exercise for at least 30 minutes on 5 or more days each week.  Do not use any products that contain nicotine or tobacco, such as cigarettes, e-cigarettes, and chewing tobacco. If you need help quitting, ask your health care provider.  If you are sexually active, practice safe sex. Use a condom or other form of protection to prevent STIs (sexually transmitted infections). What's next?  Go to your health care provider once a year for a well check visit.  Ask your health care provider how often you should have your eyes and teeth checked.  Stay up to date on all vaccines. This information is not intended to replace advice given to you by your health care provider. Make sure you discuss any questions you have with your health care provider. Document Revised: 01/25/2018 Document Reviewed: 01/25/2018 Elsevier Patient Education  2020 [employer].

## 2019-09-18 NOTE — Progress Notes (Signed)
Chief Complaint:  Max Weeks is a 34 y.o. male who presents today for his annual comprehensive physical exam.    Assessment/Plan:  Chronic Problems Addressed Today: Mild intermittent asthma without complication Stable.  Will refill albuterol today.  Essential hypertension Stable.  Continue amlodipine 5 mg daily.  Check CBC, CMET, TSH, lipid panel.  Body mass index is 28.57 kg/m. / Overweight  BMI Metric Follow Up - 09/18/19 0930      BMI Metric Follow Up-Please document annually   BMI Metric Follow Up Education provided           Preventative Healthcare: Will give final HPV today.  Check CBC, CMET, TSH, lipid panel.  Will screen for STDs per request.  Patient Counseling(The following topics were reviewed and/or handout was given):  -Nutrition: Stressed importance of moderation in sodium/caffeine intake, saturated fat and cholesterol, caloric balance, sufficient intake of fresh fruits, vegetables, and fiber.  -Stressed the importance of regular exercise.   -Substance Abuse: Discussed cessation/primary prevention of tobacco, alcohol, or other drug use; driving or other dangerous activities under the influence; availability of treatment for abuse.   -Injury prevention: Discussed safety belts, safety helmets, smoke detector, smoking near bedding or upholstery.   -Sexuality: Discussed sexually transmitted diseases, partner selection, use of condoms, avoidance of unintended pregnancy and contraceptive alternatives.   -Dental health: Discussed importance of regular tooth brushing, flossing, and dental visits.  -Health maintenance and immunizations reviewed. Please refer to Health maintenance section.  Return to care in 1 year for next preventative visit.     Subjective:  HPI:  He has no acute complaints today.   Lifestyle Diet: Working with [employer]. Exercise: Working with Catering manager.   Depression screen [facility] 2/9 09/18/2019  Decreased Interest 0  Down, Depressed, Hopeless 0  PHQ  - 2 Score 0  Altered sleeping 0  Tired, decreased energy 0  Change in appetite 0  Feeling bad or failure about yourself  0  Trouble concentrating 0  Moving slowly or fidgety/restless 0  Suicidal thoughts 0  PHQ-9 Score 0    Health Maintenance Due  Topic Date Due  . TETANUS/TDAP  Never done     ROS: Per HPI, otherwise a complete review of systems was negative.   PMH:  The following were reviewed and entered/updated in epic: Past Medical History:  Diagnosis Date  . Asthma   . Hypertension    Patient Active Problem List   Diagnosis Date Noted  . Essential hypertension 08/09/2017  . Mild intermittent asthma without complication 08/09/2017   Past Surgical History:  Procedure Laterality Date  . KNEE ARTHROSCOPY     meniscal tear - ju jitsu injury    Family History  Problem Relation Age of Onset  . Hypertension Mother   . Diabetes Maternal Grandmother   . Hypertension Maternal Grandmother   . Stroke Maternal Grandmother   . COPD Maternal Grandfather     Medications- reviewed and updated Current Outpatient Medications  Medication Sig Dispense Refill  . albuterol (VENTOLIN HFA) 108 (90 Base) MCG/ACT inhaler Inhale 2 puffs into the lungs every 6 (six) hours as needed for wheezing or shortness of breath. 18 g 0  . amLODipine (NORVASC) 5 MG tablet Take 1 tablet (5 mg total) by mouth daily. 90 tablet 3   No current facility-administered medications for this visit.    Allergies-reviewed and updated No Known Allergies  Social History   Socioeconomic History  . Marital status: Single    Spouse name: Not on  file  . Number of children: 0  . Years of education: Not on file  . Highest education level: Not on file  Occupational History  . Not on file  Tobacco Use  . Smoking status: Never Smoker  . Smokeless tobacco: Never Used  Substance and Sexual Activity  . Alcohol use: Yes    Comment: Occasional  . Drug use: Never  . Sexual activity: Not on file  Other  Topics Concern  . Not on file  Social History Narrative  . Not on file   Social Determinants of Health   Financial Resource Strain:   . Difficulty of Paying Living Expenses:   Food Insecurity:   . Worried About Programme researcher, broadcasting/film/video in the Last Year:   . Barista in the Last Year:   Transportation Needs:   . Freight forwarder (Medical):   Marland Kitchen Lack of Transportation (Non-Medical):   Physical Activity:   . Days of Exercise per Week:   . Minutes of Exercise per Session:   Stress:   . Feeling of Stress :   Social Connections:   . Frequency of Communication with Friends and Family:   . Frequency of Social Gatherings with Friends and Family:   . Attends Religious Services:   . Active Member of Clubs or Organizations:   . Attends Banker Meetings:   Marland Kitchen Marital Status:         Objective:  Physical Exam: BP 111/73   Pulse (!) 59   Temp (!) 97.4 F (36.3 C)   Ht 5\' 6"  (1.676 m)   Wt 177 lb (80.3 kg)   SpO2 98%   BMI 28.57 kg/m   Body mass index is 28.57 kg/m. Wt Readings from Last 3 Encounters:  09/18/19 177 lb (80.3 kg)  08/13/18 183 lb 6.1 oz (83.2 kg)  01/01/18 194 lb 3.2 oz (88.1 kg)   Gen: NAD, resting comfortably HEENT: TMs normal bilaterally. OP clear. No thyromegaly noted.  CV: RRR with no murmurs appreciated Pulm: NWOB, CTAB with no crackles, wheezes, or rhonchi GI: Normal bowel sounds present. Soft, Nontender, Nondistended. MSK: no edema, cyanosis, or clubbing noted Skin: warm, dry Neuro: CN2-12 grossly intact. Strength 5/5 in upper and lower extremities. Reflexes symmetric and intact bilaterally.  Psych: Normal affect and thought content     Toris Laverdiere M. 01/03/18, MD 09/18/2019 9:31 AM

## 2019-09-18 NOTE — Assessment & Plan Note (Signed)
Stable.  Will refill albuterol today.

## 2019-09-18 NOTE — Addendum Note (Signed)
Addended by: Jimmye Norman on: 09/18/2019 02:17 PM   Modules accepted: Orders

## 2019-09-19 LAB — CBC
HCT: 43.7 % (ref 38.5–50.0)
Hemoglobin: 14.2 g/dL (ref 13.2–17.1)
MCH: 28.6 pg (ref 27.0–33.0)
MCHC: 32.5 g/dL (ref 32.0–36.0)
MCV: 87.9 fL (ref 80.0–100.0)
MPV: 10.8 fL (ref 7.5–12.5)
Platelets: 272 10*3/uL (ref 140–400)
RBC: 4.97 10*6/uL (ref 4.20–5.80)
RDW: 12.7 % (ref 11.0–15.0)
WBC: 2.8 10*3/uL — ABNORMAL LOW (ref 3.8–10.8)

## 2019-09-19 LAB — HEMOGLOBIN A1C
Hgb A1c MFr Bld: 5.4 % of total Hgb (ref <5.7)
Mean Plasma Glucose: 108 (calc)
eAG (mmol/L): 6 (calc)

## 2019-09-19 LAB — COMPREHENSIVE METABOLIC PANEL
AG Ratio: 1.4 (calc) (ref 1.0–2.5)
ALT: 8 U/L — ABNORMAL LOW (ref 9–46)
AST: 15 U/L (ref 10–40)
Albumin: 4.2 g/dL (ref 3.6–5.1)
Alkaline phosphatase (APISO): 52 U/L (ref 36–130)
BUN: 14 mg/dL (ref 7–25)
CO2: 27 mmol/L (ref 20–32)
Calcium: 9.4 mg/dL (ref 8.6–10.3)
Chloride: 104 mmol/L (ref 98–110)
Creat: 1.06 mg/dL (ref 0.60–1.35)
Globulin: 3.1 g/dL (calc) (ref 1.9–3.7)
Glucose, Bld: 109 mg/dL — ABNORMAL HIGH (ref 65–99)
Potassium: 4.1 mmol/L (ref 3.5–5.3)
Sodium: 138 mmol/L (ref 135–146)
Total Bilirubin: 0.5 mg/dL (ref 0.2–1.2)
Total Protein: 7.3 g/dL (ref 6.1–8.1)

## 2019-09-19 LAB — TEST AUTHORIZATION

## 2019-09-19 LAB — LIPID PANEL
Cholesterol: 148 mg/dL (ref <200)
HDL: 43 mg/dL (ref >=40)
LDL Cholesterol (Calc): 91 mg/dL (calc)
Non-HDL Cholesterol (Calc): 105 mg/dL (calc) (ref <130)
Total CHOL/HDL Ratio: 3.4 (calc) (ref <5.0)
Triglycerides: 58 mg/dL (ref <150)

## 2019-09-19 LAB — HIV ANTIBODY (ROUTINE TESTING W REFLEX): HIV 1&2 Ab, 4th Generation: NONREACTIVE

## 2019-09-19 LAB — VITAMIN D 25 HYDROXY (VIT D DEFICIENCY, FRACTURES): Vit D, 25-Hydroxy: 25 ng/mL — ABNORMAL LOW (ref 30–100)

## 2019-09-19 LAB — TSH: TSH: 1.86 mIU/L (ref 0.40–4.50)

## 2019-09-19 LAB — RPR: RPR Ser Ql: NONREACTIVE

## 2019-09-19 NOTE — Addendum Note (Signed)
Addended by: Laddie Aquas A on: 09/19/2019 03:57 PM   Modules accepted: Orders

## 2019-09-19 NOTE — Progress Notes (Signed)
Please inform patient of the following:  Vitamin D slightly low but everything else is NORMAL. He can take 2000-5000IU of vitamin D daily and we can recheck in a year or so.   Katina Degree. Jimmey Ralph, MD 09/19/2019 12:08 PM

## 2019-09-20 LAB — URINE CYTOLOGY ANCILLARY ONLY
Chlamydia: NEGATIVE
Comment: NEGATIVE
Comment: NEGATIVE
Comment: NORMAL
Neisseria Gonorrhea: NEGATIVE
Trichomonas: NEGATIVE

## 2019-10-02 DIAGNOSIS — F4322 Adjustment disorder with anxiety: Secondary | ICD-10-CM | POA: Diagnosis not present

## 2019-10-07 DIAGNOSIS — F4322 Adjustment disorder with anxiety: Secondary | ICD-10-CM | POA: Diagnosis not present

## 2019-10-16 DIAGNOSIS — F4322 Adjustment disorder with anxiety: Secondary | ICD-10-CM | POA: Diagnosis not present

## 2019-10-22 DIAGNOSIS — F4322 Adjustment disorder with anxiety: Secondary | ICD-10-CM | POA: Diagnosis not present

## 2019-11-05 DIAGNOSIS — F4322 Adjustment disorder with anxiety: Secondary | ICD-10-CM | POA: Diagnosis not present

## 2019-11-13 DIAGNOSIS — F4322 Adjustment disorder with anxiety: Secondary | ICD-10-CM | POA: Diagnosis not present

## 2019-11-13 MED FILL — AMLODIPINE BESYLATE 5 MG TA: 5 | 90 days supply | Qty: 90 | Fill #0

## 2019-11-13 MED FILL — ALBUTEROL SULFATE HFA 108 (: 108 (90 BAS | 25 days supply | Qty: 18 | Fill #0

## 2019-11-19 DIAGNOSIS — F4322 Adjustment disorder with anxiety: Secondary | ICD-10-CM | POA: Diagnosis not present

## 2019-11-29 MED FILL — AMLODIPINE BESYLATE 5 MG TA: 5 | 90 days supply | Qty: 90 | Fill #0

## 2019-11-29 MED FILL — ALBUTEROL SULFATE HFA 108 (: 108 (90 BAS | 25 days supply | Qty: 18 | Fill #0

## 2019-12-03 DIAGNOSIS — F4322 Adjustment disorder with anxiety: Secondary | ICD-10-CM | POA: Diagnosis not present

## 2019-12-11 DIAGNOSIS — F4322 Adjustment disorder with anxiety: Secondary | ICD-10-CM | POA: Diagnosis not present

## 2019-12-31 DIAGNOSIS — F4322 Adjustment disorder with anxiety: Secondary | ICD-10-CM | POA: Diagnosis not present

## 2020-01-22 DIAGNOSIS — F4322 Adjustment disorder with anxiety: Secondary | ICD-10-CM | POA: Diagnosis not present

## 2020-02-05 DIAGNOSIS — F4322 Adjustment disorder with anxiety: Secondary | ICD-10-CM | POA: Diagnosis not present

## 2020-02-24 DIAGNOSIS — F4322 Adjustment disorder with anxiety: Secondary | ICD-10-CM | POA: Diagnosis not present

## 2020-02-29 ENCOUNTER — Encounter: Payer: Self-pay | Admitting: Family Medicine

## 2020-03-04 DIAGNOSIS — F4322 Adjustment disorder with anxiety: Secondary | ICD-10-CM | POA: Diagnosis not present

## 2020-03-10 ENCOUNTER — Encounter: Payer: Self-pay | Admitting: Family Medicine

## 2020-03-10 ENCOUNTER — Ambulatory Visit: Payer: 59 | Admitting: Family Medicine

## 2020-03-10 ENCOUNTER — Other Ambulatory Visit: Payer: Self-pay

## 2020-03-10 DIAGNOSIS — G43809 Other migraine, not intractable, without status migrainosus: Secondary | ICD-10-CM | POA: Diagnosis not present

## 2020-03-10 DIAGNOSIS — G43909 Migraine, unspecified, not intractable, without status migrainosus: Secondary | ICD-10-CM | POA: Insufficient documentation

## 2020-03-10 DIAGNOSIS — R519 Headache, unspecified: Secondary | ICD-10-CM

## 2020-03-10 NOTE — Progress Notes (Signed)
   Max Weeks is a 35 y.o. male who presents today for an office visit.  Assessment/Plan:  Chronic Problems Addressed Today: Migraine No red flags. Reassuring neuro exam.  Likely new onset migraines.  Will check MRI to rule out other possible causes.  He can continue using over-the-counter meds as needed.  He will let me know if he needs prescription strength abortive medications or if symptoms change.  He will also get eye exam soon.     Subjective:  HPI:  Patient here with concern for migraines.  First had migraine about 4 months ago.  This was preceded by an aura described as blurry lines in his vision.  Symptoms subsided after few hours.  They again returned about 2 weeks ago.  He had 2 migraines in a week at this time.  Symptoms resolved with over-the-counter meds including Tylenol and ibuprofen.  He has not had any other neurologic symptoms since then.  No new weakness or numbness.  No other vision changes.  No history of migraines.  No current symptoms.       Objective:  Physical Exam: BP 131/79   Pulse 75   Temp 98.3 F (36.8 C) (Temporal)   Ht 5\' 6"  (1.676 m)   Wt 191 lb 9.6 oz (86.9 kg)   SpO2 97%   BMI 30.93 kg/m   Gen: No acute distress, resting comfortably CV: Regular rate and rhythm with no murmurs appreciated Pulm: Normal work of breathing, clear to auscultation bilaterally with no crackles, wheezes, or rhonchi Neuro: Cranial nerves II through XII intact.  Strength 5 out of 5 in upper and lower extremities.  Finger-nose-finger intact bilaterally. Psych: Normal affect and thought content      Quron Ruddy M. , MD 03/10/2020 2:42 PM

## 2020-03-10 NOTE — Patient Instructions (Signed)
It was very nice to see you today!  We will check an MRI.  Please let me know if your symptoms change or if you need [place] to send any medications to help with migraines.  Take care, Dr Jimmey Ralph  Please try these tips to maintain a healthy lifestyle:   Eat at least 3 REAL meals and 1-2 snacks per day.  Aim for no more than 5 hours between eating.  If you eat breakfast, please do so within one hour of getting up.    Each meal should contain half fruits/vegetables, one quarter protein, and one quarter carbs (no bigger than a computer mouse)   Cut down on sweet beverages. This includes juice, soda, and sweet tea.     Drink at least 1 glass of water with each meal and aim for at least 8 glasses per day   Exercise at least 150 minutes every week.

## 2020-03-10 NOTE — Assessment & Plan Note (Addendum)
No red flags. Reassuring neuro exam.  Likely new onset migraines.  Will check MRI to rule out other possible causes.  He can continue using over-the-counter meds as needed.  He will let me know if he needs prescription strength abortive medications or if symptoms change.  He will also get eye exam soon.

## 2020-03-18 DIAGNOSIS — F4322 Adjustment disorder with anxiety: Secondary | ICD-10-CM | POA: Diagnosis not present

## 2020-03-21 ENCOUNTER — Ambulatory Visit
Admission: RE | Admit: 2020-03-21 | Discharge: 2020-03-21 | Disposition: A | Discharge status: Home / Self Care | Payer: 59 | Source: Ambulatory Visit | Attending: Family Medicine | Admitting: Family Medicine

## 2020-03-21 ENCOUNTER — Other Ambulatory Visit: Payer: Self-pay

## 2020-03-21 DIAGNOSIS — R519 Headache, unspecified: Secondary | ICD-10-CM

## 2020-03-21 DIAGNOSIS — J341 Cyst and mucocele of nose and nasal sinus: Secondary | ICD-10-CM | POA: Diagnosis not present

## 2020-03-23 NOTE — Progress Notes (Signed)
Please inform patient of the following:  Good news! Essentially normal MRI with incidental mucosal thickening of the sinuses. Wouldn't hurt to try an allergy medication if he wants. Would like for him to me know if things change or worsen.

## 2020-03-24 ENCOUNTER — Other Ambulatory Visit: Payer: 59

## 2020-04-01 DIAGNOSIS — F4322 Adjustment disorder with anxiety: Secondary | ICD-10-CM | POA: Diagnosis not present

## 2020-04-15 DIAGNOSIS — F4322 Adjustment disorder with anxiety: Secondary | ICD-10-CM | POA: Diagnosis not present

## 2020-04-20 MED FILL — AMLODIPINE BESYLATE 5 MG TA: 5 | 90 days supply | Qty: 90 | Fill #1

## 2020-04-29 DIAGNOSIS — F4322 Adjustment disorder with anxiety: Secondary | ICD-10-CM | POA: Diagnosis not present

## 2020-05-13 DIAGNOSIS — F4322 Adjustment disorder with anxiety: Secondary | ICD-10-CM | POA: Diagnosis not present

## 2020-05-14 ENCOUNTER — Ambulatory Visit (INDEPENDENT_AMBULATORY_CARE_PROVIDER_SITE_OTHER): Payer: 59 | Admitting: Family Medicine

## 2020-05-14 ENCOUNTER — Ambulatory Visit: Payer: Self-pay

## 2020-05-14 ENCOUNTER — Encounter: Payer: Self-pay | Admitting: Family Medicine

## 2020-05-14 ENCOUNTER — Other Ambulatory Visit: Payer: Self-pay

## 2020-05-14 VITALS — BP 120/78 | HR 67 | Ht 66.0 in | Wt 196.2 lb

## 2020-05-14 DIAGNOSIS — M25512 Pain in left shoulder: Secondary | ICD-10-CM | POA: Diagnosis not present

## 2020-05-14 DIAGNOSIS — M25531 Pain in right wrist: Secondary | ICD-10-CM

## 2020-05-14 NOTE — Patient Instructions (Addendum)
Thank you for coming in today.  Please complete the exercises that the athletic trainer went over with you: View at [email or web address] using code: [email or web address]  I did order PT.  Schedule it out for 3 weeks ish. If better cancel and if not better go.   Recheck in about 6 weeks especially if not improving.   Keep me updated.

## 2020-05-14 NOTE — Progress Notes (Signed)
   I, Philbert Riser, LAT, ATC acting as a scribe for Clementeen Graham, MD.  Subjective:    CC: R wrist pain  HPI: Pt is a 35 y/o male c/o R wrist pain ongoing for 3 weeks. MOI: Pt has been going to the climbing gym a lot and using wrist to pull himself up. Pt locates pain to palmar and ulnar aspect of the distal forearm.   Swelling: no Grip strength: no change Numbness/tingling: no Aggravates: carrying items w/ grip Treatments tried: IBU, Voltaren gel  Pt also c/o L shoulder pain x 3 weeks. Pt reports discomfort when reaching above head (shoulder flexion). Pt locates pain to the anterior aspect of L shoulder.  Pertinent review of Systems: No fevers or chills  Relevant historical information: Hypertension, asthma.  Patient works as a Licensed conveyancer.   Objective:    Vitals:   05/14/20 1342  BP: 120/78  Pulse: 67  SpO2: 98%   General: Well Developed, well nourished, and in no acute distress.   MSK: Right wrist normal-appearing nontender normal motion normal strength.  Some pain with resisted ulnar deviation.  Minimal pain with Finkelstein's test.  Left shoulder normal-appearing Nontender. Normal motion. Strength 4+/5 to abduction. 5/5 internal and external rotation. Minimally positive Hawkins and Neer's test.  Minimally positive empty can test. Negative Yergason's and speeds test. Negative O'Brien's test. Negative crossover arm compression test.    Lab and Radiology Results  Diagnostic Limited MSK Ultrasound of: Right wrist and left shoulder Carpal tunnel normal-appearing flexor tendons and carpal tunnel structures. Dorsal wrist normal-appearing No joint effusion. Normal TFCC  Left shoulder biceps tendon normal-appearing Subscapularis normal. Supraspinatus tendon hypoechoic change within the mid substance tendon consistent with a intersubstance tear.  No retraction visible.  No significant vascular activity on Doppler. Infraspinatus tendon normal-appearing AC joint  normal-appearing  Impression: Right wrist normal-appearing Left shoulder possible supraspinatus intrasubstance tear without retraction.   Impression and Recommendations:    Assessment and Plan: 35 y.o. male with  Right wrist pain possible tendinopathy versus overuse.  Plan for home exercise program and referral to hand PT.  If not improving consider MRI arthrogram.  Left shoulder pain concerning for possible incomplete supraspinatus tear without retraction.  Plan for physical therapy and home exercise program.  Discussed nitroglycerin patch protocol patient would like to wait on that for now.  Recheck in about 6 weeks.  PDMP not reviewed this encounter. Orders Placed This Encounter  Procedures  . [place] LIMITED JOINT SPACE STRUCTURES UP LEFT(NO LINKED CHARGES)    Standing Status:   Future    Number of Occurrences:   1    Standing Expiration Date:   11/13/2020    Order Specific Question:   Reason for Exam (SYMPTOM  OR DIAGNOSIS REQUIRED)    Answer:   left shoulder pain    Order Specific Question:   Preferred imaging location?    Answer:   Adult [facility]  . Ambulatory referral to Physical Therapy    Referral Priority:   Routine    Referral Type:   Physical Medicine    Referral Reason:   Specialty Services Required    Requested Specialty:   Physical Therapy    Number of Visits Requested:   1   No orders of the defined types were placed in this encounter.   Discussed warning signs or symptoms. Please see discharge instructions. Patient expresses understanding.   The above documentation has been reviewed and is accurate and complete Clementeen Graham, M.D.

## 2020-05-18 ENCOUNTER — Ambulatory Visit: Payer: 59 | Admitting: Family Medicine

## 2020-05-18 ENCOUNTER — Encounter: Payer: Self-pay | Admitting: Family Medicine

## 2020-05-18 ENCOUNTER — Other Ambulatory Visit (HOSPITAL_COMMUNITY): Payer: Self-pay

## 2020-05-18 MED ORDER — NITROGLYCERIN 0.2 MG/HR TD PT24
MEDICATED_PATCH | TRANSDERMAL | 1 refills | Status: DC
  Filled 2020-05-18: qty 7, 28d supply, fill #0
  Filled 2020-06-15: qty 7, 28d supply, fill #1
  Filled 2020-07-17: qty 7, 28d supply, fill #2

## 2020-05-20 LAB — BASIC METABOLIC PANEL
BUN: 14 (ref 4–21)
CO2: 19 (ref 13–22)
Chloride: 105 (ref 99–108)
Creatinine: 1 (ref 0.6–1.3)
Glucose: 71
Potassium: 4.6 (ref 3.4–5.3)
Sodium: 140 (ref 137–147)

## 2020-05-20 LAB — CBC AND DIFFERENTIAL
HCT: 41 (ref 41–53)
Hemoglobin: 13.5 (ref 13.5–17.5)
Neutrophils Absolute: 1.3
Platelets: 306 (ref 150–399)
WBC: 3.9

## 2020-05-20 LAB — LIPID PANEL
Cholesterol: 152 (ref 0–200)
HDL: 46 (ref 35–70)
LDL Cholesterol: 15
LDl/HDL Ratio: 91
Triglycerides: 76 (ref 40–160)

## 2020-05-20 LAB — HEPATIC FUNCTION PANEL
ALT: 13 (ref 10–40)
AST: 22 (ref 14–40)
Alkaline Phosphatase: 77 (ref 25–125)
Bilirubin, Total: 0.3

## 2020-05-20 LAB — COMPREHENSIVE METABOLIC PANEL
Albumin: 4.2 (ref 3.5–5.0)
Calcium: 9.3 (ref 8.7–10.7)
GFR calc non Af Amer: 97
Globulin: 3.1

## 2020-05-20 LAB — CBC: RBC: 4.75 (ref 3.87–5.11)

## 2020-05-20 LAB — TSH: TSH: 1.92 (ref 0.41–5.90)

## 2020-05-20 LAB — HEMOGLOBIN A1C: Hemoglobin A1C: 5.6

## 2020-05-27 ENCOUNTER — Other Ambulatory Visit: Payer: Self-pay

## 2020-05-27 ENCOUNTER — Ambulatory Visit (INDEPENDENT_AMBULATORY_CARE_PROVIDER_SITE_OTHER): Payer: 59 | Admitting: Family Medicine

## 2020-05-27 VITALS — BP 135/73 | HR 69 | Ht 66.0 in | Wt 195.6 lb

## 2020-05-27 DIAGNOSIS — M25512 Pain in left shoulder: Secondary | ICD-10-CM

## 2020-05-27 DIAGNOSIS — F4322 Adjustment disorder with anxiety: Secondary | ICD-10-CM | POA: Diagnosis not present

## 2020-05-27 NOTE — Patient Instructions (Signed)
Thank you for coming in today.  Continue the home exercises and PT.   Let me know how it goes.

## 2020-05-27 NOTE — Progress Notes (Signed)
   I, Philbert Riser, LAT, ATC acting as a scribe for Clementeen Graham, MD.  Max Weeks is a 35 y.o. male who presents to [facility] at [facility] today for f/u of R wrist pain due to a recent increase in rock climbing and L shoulder pain.  He was last seen by Dr. Denyse Amass on 05/14/20 and was provided a HEP and referred to PT.  Since his last visit, pt reports wrist isn't bothering him. Pt reports L shoulder started bothering him more yesterday. Pt has been compliant w/ HEP. Pt developed some soreness after doing his HEP on Monday. Pt has been doing Nitro patches.   Patient is already started physical therapy at [facility] rehabilitation.     Pertinent review of systems: No fevers or chills  Relevant historical information: Hypertension   Exam:  BP 135/73 (BP Location: Right Arm, Patient Position: Sitting, Cuff Size: Normal)   Pulse 69   Ht 5\' 6"  (1.676 m)   Wt 195 lb 9.6 oz (88.7 kg)   SpO2 99%   BMI 31.57 kg/m  General: Well Developed, well nourished, and in no acute distress.   MSK: Left shoulder normal-appearing nontender normal motion. Mild pop felt with abduction range of motion. Intact strength. Minimally positive Hawkins and Neer's test. Negative Yergason's and speeds test. Minimally positive O'Brien's test. Negative clunk and relocation test.     Assessment and Plan: 35 y.o. male with left shoulder pain thought to be due to rotator cuff tendinopathy with possible interstitial rotator cuff tear.  Patient may have some labral pathology as well but this is less likely.  Already using nitroglycerin patch protocol.  We will proceed to physical therapy as already ordered.  If not improving in about 4 weeks consider MRI arthrogram of the left shoulder.  However patient I anticipate will do quite well.  Recheck as needed.    Discussed warning signs or symptoms. Please see discharge instructions. Patient expresses understanding.   The above documentation has  been reviewed and is accurate and complete 20, M.D.

## 2020-05-28 ENCOUNTER — Ambulatory Visit: Payer: 59 | Admitting: Family Medicine

## 2020-06-01 DIAGNOSIS — M25512 Pain in left shoulder: Secondary | ICD-10-CM | POA: Diagnosis not present

## 2020-06-10 DIAGNOSIS — F4322 Adjustment disorder with anxiety: Secondary | ICD-10-CM | POA: Diagnosis not present

## 2020-06-10 DIAGNOSIS — M25512 Pain in left shoulder: Secondary | ICD-10-CM | POA: Diagnosis not present

## 2020-06-15 ENCOUNTER — Other Ambulatory Visit (HOSPITAL_COMMUNITY): Payer: Self-pay

## 2020-06-15 DIAGNOSIS — M25512 Pain in left shoulder: Secondary | ICD-10-CM | POA: Diagnosis not present

## 2020-06-24 DIAGNOSIS — M25512 Pain in left shoulder: Secondary | ICD-10-CM | POA: Diagnosis not present

## 2020-06-24 NOTE — Progress Notes (Signed)
   I, [name], LAT, ATC, am serving as scribe for Dr. Clementeen Graham.  Max Weeks is a 35 y.o. male who presents to [facility] at [facility] today for f/u of L shoulder pain and R wrist pain.  He was last seen by Dr. Denyse Amass on 05/27/20 for R wrist pain f/u that had resolved and for his L shoulder.  He was doing his HEP consistently and using nitroglycerin patches.  He was previously referred to PT at [facility] and has completed 5 visits.  Since his last visit, pt reports shoulder pain is much improved, but will have times when it is "achy." Pt reports he R wrist hasn't been treating much. Pt notes only having wrist pain when pulling.    Pertinent review of systems: No fevers or chills  Relevant historical information: Hypertension   Exam:  BP 112/70 (BP Location: Right Arm, Patient Position: Sitting, Cuff Size: Normal)   Pulse (!) 59   Ht 5\' 6"  (1.676 m)   Wt 195 lb 12.8 oz (88.8 kg)   SpO2 97%   BMI 31.60 kg/m  General: Well Developed, well nourished, and in no acute distress.   MSK: Left shoulder normal-appearing Normal motion. Pain with abduction. Minimally positive Hawkins and Neer's test.  Mildly positive empty can test. Strength 4+/5 abduction.  5/5 external and internal rotation. Positive O'Brien test. Negative clunk and relocation test.  Right wrist normal-appearing Mildly tender palpation dorsal radial wrist. Normal wrist motion. Intact strength.    Lab and Radiology Results  X-ray images right wrist and left shoulder obtained today personally and independently interpreted  Left shoulder: No acute abnormalities.  No significant degenerative changes.  Right wrist: No acute abnormalities.  No significant degenerative changes.  Await formal radiology review     Assessment and Plan: 35 y.o. male with left shoulder pain.  Persistent but slightly improving with physical therapy.  Patient does have mechanical symptoms and does have  some concern for rotator cuff tendinopathy or labral tear.  Plan to finish out PT and proceed to home exercise program.  Advance activity as tolerated and if unable to resume normal level of activity next step would be MRI arthrogram.  He will let me know.  Right wrist pain lesser issue.  Watchful waiting and if not improving next step would be hand PT.   PDMP not reviewed this encounter. Orders Placed This Encounter  Procedures  . DG Shoulder Left    Standing Status:   Future    Standing Expiration Date:   06/25/2021    Order Specific Question:   Reason for Exam (SYMPTOM  OR DIAGNOSIS REQUIRED)    Answer:   eval shoudler pain    Order Specific Question:   Preferred imaging location?    Answer:   08/25/2021  . DG Wrist Complete Right    Standing Status:   Future    Standing Expiration Date:   06/25/2021    Order Specific Question:   Reason for Exam (SYMPTOM  OR DIAGNOSIS REQUIRED)    Answer:   eval wrist pain    Order Specific Question:   Preferred imaging location?    Answer:   08/25/2021   No orders of the defined types were placed in this encounter.    Discussed warning signs or symptoms. Please see discharge instructions. Patient expresses understanding.   The above documentation has been reviewed and is accurate and complete Kyra Searles, M.D.

## 2020-06-25 ENCOUNTER — Ambulatory Visit (INDEPENDENT_AMBULATORY_CARE_PROVIDER_SITE_OTHER): Payer: 59 | Admitting: Family Medicine

## 2020-06-25 ENCOUNTER — Other Ambulatory Visit: Payer: Self-pay

## 2020-06-25 ENCOUNTER — Ambulatory Visit (INDEPENDENT_AMBULATORY_CARE_PROVIDER_SITE_OTHER): Payer: 59

## 2020-06-25 VITALS — BP 112/70 | HR 59 | Ht 66.0 in | Wt 195.8 lb

## 2020-06-25 DIAGNOSIS — M25531 Pain in right wrist: Secondary | ICD-10-CM

## 2020-06-25 DIAGNOSIS — M25512 Pain in left shoulder: Secondary | ICD-10-CM

## 2020-06-25 NOTE — Patient Instructions (Signed)
Thank you for coming in today.  Please get an Xray today before you leave  Doreatha Martin out PT.   Resume and advance activity.   If the shoulder is not good enough next step is MRI arthrogram. Let me know.   Wrist: If not ok Hand PT.

## 2020-06-29 DIAGNOSIS — M25512 Pain in left shoulder: Secondary | ICD-10-CM | POA: Diagnosis not present

## 2020-06-29 NOTE — Progress Notes (Signed)
Right wrist x-ray looks normal to radiology

## 2020-06-29 NOTE — Progress Notes (Signed)
Left shoulder x-ray looks normal to radiology

## 2020-07-08 DIAGNOSIS — F4322 Adjustment disorder with anxiety: Secondary | ICD-10-CM | POA: Diagnosis not present

## 2020-07-08 DIAGNOSIS — M25512 Pain in left shoulder: Secondary | ICD-10-CM | POA: Diagnosis not present

## 2020-07-14 ENCOUNTER — Other Ambulatory Visit: Payer: Self-pay

## 2020-07-14 ENCOUNTER — Ambulatory Visit (INDEPENDENT_AMBULATORY_CARE_PROVIDER_SITE_OTHER): Payer: 59 | Admitting: Family Medicine

## 2020-07-14 ENCOUNTER — Ambulatory Visit: Payer: Self-pay

## 2020-07-14 VITALS — BP 122/80 | HR 57 | Ht 66.0 in | Wt 195.8 lb

## 2020-07-14 DIAGNOSIS — M79645 Pain in left finger(s): Secondary | ICD-10-CM

## 2020-07-14 NOTE — Progress Notes (Signed)
I, [name], LAT, ATC, am serving as scribe for Dr. Clementeen Graham.  Max Weeks is a 35 y.o. male who presents to [facility] at [facility] today for L 3rd trigger finger ongoing since Sunday, 07/12/20.  Pt has a hx of trigger finger on the R hand 15 years ago. He was last seen by Dr. Denyse Amass on 06/25/20 for L shoulder and R wrist pain f/u.  Since then, pt reports he has been wearing a splint. Pt demonstrates full finger ROM, but is afraid if he tries to grip something tightly it will lock. Pt locates locking to L 3rd MCP joint. Pt is R-hand dominate.  Swelling: slight Aggravating factors: gripping Treatments tried: splint, IBU, Voltaren gel   Pertinent review of systems: No fevers or chills  Relevant historical information: Hypertension   Exam:  BP 122/80 (BP Location: Right Arm, Patient Position: Sitting, Weeks Size: Normal)   Pulse (!) 57   Ht 5\' 6"  (1.676 m)   Wt 195 lb 12.8 oz (88.8 kg)   SpO2 99%   BMI 31.60 kg/m  General: Well Developed, well nourished, and in no acute distress.   MSK: Left hand normal-appearing with well-formed calluses from weightlifting. Normal hand motion and strength. No triggering palpated with flexion of PIP. Intact strength.    Lab and Radiology Results  Diagnostic Limited MSK Ultrasound of: Left third MCP palmar aspect Intact normal-appearing flexor tendon. Prominent ridge of bone at palmar MCP head radial aspect Impression: Abnormal prominent ridge of bone radial MCP      Assessment and Plan: 35 y.o. male with abnormal hand flexion intermittent problem left hand.  Patient had a similar episode right hand 15 years ago that resolved with conservative management.  This is not trigger finger.  Based on ultrasound appearance I think it is possible that Caldwell's flexor tendon subluxes over the prominent ridge at the palmar MCP when his hand is in a flexed position with a lot of force and then gets stuck in that position.   I was unable to reproduce this in clinic today but based on the anatomical appearance I think that is probably what is happening.  As this appears to be a very intermittent problem I think a bit of watchful waiting and conservative management is reasonable.  Plan for double Band-Aid splint across PIP to limit full flexion of the hand and watchful waiting.  Should this persistently recur developed a strategy to reduce the subluxed tendon and would recommend referral to both hand therapy and hand surgery for consultation and for hand therapy.  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  He will contact me as needed.   PDMP not reviewed this encounter. Orders Placed This Encounter  Procedures  . Elijah Birk LIMITED JOINT SPACE STRUCTURES UP LEFT(NO LINKED CHARGES)    Standing Status:   Future    Number of Occurrences:   1    Standing Expiration Date:   01/13/2021    Order Specific Question:   Reason for Exam (SYMPTOM  OR DIAGNOSIS REQUIRED)    Answer:   left finger pain    Order Specific Question:   Preferred imaging location?    Answer:   [facility]   No orders of the defined types were placed in this encounter.    Discussed warning signs or symptoms. Please see discharge instructions. Patient expresses understanding.   The above documentation has been reviewed and is accurate and complete 01/15/2021, M.D.

## 2020-07-14 NOTE — Patient Instructions (Signed)
Thank you for coming in today.  This is not the classic trigger finger.  I think the flexor tendon is popping (sublexing) over that ridge of bone in the hand.   Splint for a week or two.   If not better let me know. I can refer to hand surgery and hand therapy at the same time.

## 2020-07-17 ENCOUNTER — Other Ambulatory Visit (HOSPITAL_COMMUNITY): Payer: Self-pay

## 2020-07-22 DIAGNOSIS — F4322 Adjustment disorder with anxiety: Secondary | ICD-10-CM | POA: Diagnosis not present

## 2020-07-22 DIAGNOSIS — M25512 Pain in left shoulder: Secondary | ICD-10-CM | POA: Diagnosis not present

## 2020-08-05 DIAGNOSIS — M25512 Pain in left shoulder: Secondary | ICD-10-CM | POA: Diagnosis not present

## 2020-08-05 DIAGNOSIS — F4322 Adjustment disorder with anxiety: Secondary | ICD-10-CM | POA: Diagnosis not present

## 2020-08-19 DIAGNOSIS — F4322 Adjustment disorder with anxiety: Secondary | ICD-10-CM | POA: Diagnosis not present

## 2020-09-04 ENCOUNTER — Other Ambulatory Visit (HOSPITAL_COMMUNITY): Payer: Self-pay

## 2020-09-04 ENCOUNTER — Telehealth: Payer: 59 | Admitting: Emergency Medicine

## 2020-09-04 ENCOUNTER — Encounter: Payer: Self-pay | Admitting: Family Medicine

## 2020-09-04 DIAGNOSIS — U071 COVID-19: Secondary | ICD-10-CM | POA: Diagnosis not present

## 2020-09-04 MED ORDER — NIRMATRELVIR/RITONAVIR (PAXLOVID)TABLET
3.0000 | ORAL_TABLET | Freq: Two times a day (BID) | ORAL | 0 refills | Status: AC
  Filled 2020-09-04: qty 30, 5d supply, fill #0

## 2020-09-04 MED ORDER — ALBUTEROL SULFATE HFA 108 (90 BASE) MCG/ACT IN AERS
2.0000 | INHALATION_SPRAY | RESPIRATORY_TRACT | 3 refills | Status: DC | PRN
  Filled 2020-09-04: qty 18, 16d supply, fill #0

## 2020-09-04 MED ORDER — BENZONATATE 100 MG PO CAPS
100.0000 mg | ORAL_CAPSULE | Freq: Two times a day (BID) | ORAL | 0 refills | Status: DC | PRN
  Filled 2020-09-04: qty 20, 10d supply, fill #0

## 2020-09-04 MED FILL — Amlodipine Besylate Tab 5 MG (Base Equivalent): ORAL | 90 days supply | Qty: 90 | Fill #0 | Status: AC

## 2020-09-04 MED FILL — Amlodipine Besylate Tab 5 MG (Base Equivalent): ORAL | 90 days supply | Qty: 90 | Fill #0 | Status: CN

## 2020-09-04 NOTE — Progress Notes (Signed)
Virtual Visit Consent   Max Weeks, you are scheduled for a virtual visit with a [facility] provider today.     Just as with appointments in the office, your consent must be obtained to participate.  Your consent will be active for this visit and any virtual visit you may have with one of our providers in the next 365 days.     If you have a MyChart account, a copy of this consent can be sent to you electronically.  All virtual visits are billed to your insurance company just like a traditional visit in the office.    As this is a virtual visit, video technology does not allow for your provider to perform a traditional examination.  This may limit your provider's ability to fully assess your condition.  If your provider identifies any concerns that need to be evaluated in person or the need to arrange testing (such as labs, EKG, etc.), we will make arrangements to do so.     Although advances in technology are sophisticated, we cannot ensure that it will always work on either your end or our end.  If the connection with a video visit is poor, the visit may have to be switched to a telephone visit.  With either a video or telephone visit, we are not always able to ensure that we have a secure connection.     I need to obtain your verbal consent now.   Are you willing to proceed with your visit today?    Max Weeks has provided verbal consent on 09/04/2020 for a virtual visit video.   Max Weeks, Max Weeks   Date: 09/04/2020 2:10 PM   Virtual Visit via Video Note   I, Max Weeks, connected with  Max Weeks  ([id number], [birth year]) on 09/04/20 at  2:15 PM EDT by a video-enabled telemedicine application and verified that I am speaking with the correct person using two identifiers.  Location: Patient: Virtual Visit Location Patient: Home Provider: Virtual Visit Location Provider: Home Office   I discussed the limitations of evaluation and management by telemedicine and the  availability of in person appointments. The patient expressed understanding and agreed to proceed.    History of Present Illness: Max Weeks is a 35 y.o. who identifies as a male who was assigned male at birth, and is being seen today for COVID-19.  Has had a positive test today. Has hx of HTN and asthma.  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  Reports mild URI symptoms.  Interested in antiviral.  HPI: HPI  Problems:  Patient Active Problem List   Diagnosis Date Noted   Migraine 03/10/2020   Essential hypertension 08/09/2017   Mild intermittent asthma without complication 08/09/2017    Allergies: No Known Allergies Medications:  Current Outpatient Medications:    albuterol (VENTOLIN HFA) 108 (90 Base) MCG/ACT inhaler, INHALE 2 PUFFS INTO THE LUNGS EVERY 6 (SIX) HOURS AS NEEDED FOR WHEEZING OR SHORTNESS OF BREATH., Disp: 18 g, Rfl: 0   amLODipine (NORVASC) 5 MG tablet, TAKE 1 TABLET (5 MG TOTAL) BY MOUTH DAILY., Disp: 90 tablet, Rfl: 3   nitroGLYCERIN (NITRODUR - DOSED IN MG/24 HR) 0.2 mg/hr patch, Apply 1/4 patch daily to tendon for tendonitis., Disp: 30 patch, Rfl: 1  Observations/Objective: Patient is well-developed, well-nourished in no acute distress.  Resting comfortably at home.  Head is normocephalic, atraumatic.  No labored breathing. Speech is clear and coherent with logical content.  Patient is alert and oriented at  baseline.    Assessment and Plan: 1. COVID-19 - Paxlovid as directed - Albuterol inhaler q4hr prn -Tessalon 100mg  q12hr prn   Follow Up Instructions: I discussed the assessment and treatment plan with the patient. The patient was provided an opportunity to ask questions and all were answered. The patient agreed with the plan and demonstrated an understanding of the instructions.  A copy of instructions were sent to the patient via MyChart.  The patient was advised to call back or seek an in-person evaluation if the symptoms worsen or if the condition fails to  improve as anticipated.  Time:  I spent 13 minutes with the patient via telehealth technology discussing the above problems/concerns.    , Max Weeks

## 2020-09-04 NOTE — Patient Instructions (Signed)
Max Weeks, thank you for joining Max Horseman, PA-C for today's virtual visit.  While this provider is not your primary care provider (PCP), if your PCP is located in our provider database this encounter information will be shared with them immediately following your visit.  Consent: (Patient) Max Weeks provided verbal consent for this virtual visit at the beginning of the encounter.  Current Medications:  Current Outpatient Medications:    albuterol (VENTOLIN HFA) 108 (90 Base) MCG/ACT inhaler, Inhale 2 puffs into the lungs every 4 (four) hours as needed for wheezing or shortness of breath., Disp: 1 each, Rfl: 3   benzonatate (TESSALON) 100 MG capsule, Take 1 capsule (100 mg total) by mouth 2 (two) times daily as needed for cough., Disp: 20 capsule, Rfl: 0   nirmatrelvir/ritonavir EUA (PAXLOVID) TABS, Take 3 tablets by mouth 2 (two) times daily for 5 days. (Take nirmatrelvir 150 mg two tablets twice daily for 5 days and ritonavir 100 mg one tablet twice daily for 5 days) Patient GFR is >60, Disp: 30 tablet, Rfl: 0   amLODipine (NORVASC) 5 MG tablet, TAKE 1 TABLET (5 MG TOTAL) BY MOUTH DAILY., Disp: 90 tablet, Rfl: 3   nitroGLYCERIN (NITRODUR - DOSED IN MG/24 HR) 0.2 mg/hr patch, Apply 1/4 patch daily to tendon for tendonitis., Disp: 30 patch, Rfl: 1   Medications ordered in this encounter:  Meds ordered this encounter  Medications   nirmatrelvir/ritonavir EUA (PAXLOVID) TABS    Sig: Take 3 tablets by mouth 2 (two) times daily for 5 days. (Take nirmatrelvir 150 mg two tablets twice daily for 5 days and ritonavir 100 mg one tablet twice daily for 5 days) Patient GFR is >60    Dispense:  30 tablet    Refill:  0    Order Specific Question:   Supervising Provider    Answer:   Hyacinth Meeker, BRIAN [[id number]]   benzonatate (TESSALON) 100 MG capsule    Sig: Take 1 capsule (100 mg total) by mouth 2 (two) times daily as needed for cough.    Dispense:  20 capsule    Refill:  0    Order Specific  Question:   Supervising Provider    Answer:   MILLER, BRIAN [[id number]]   albuterol (VENTOLIN HFA) 108 (90 Base) MCG/ACT inhaler    Sig: Inhale 2 puffs into the lungs every 4 (four) hours as needed for wheezing or shortness of breath.    Dispense:  1 each    Refill:  3    Order Specific Question:   Supervising Provider    Answer:   Hyacinth Meeker, BRIAN [[id number]]     *If you need refills on other medications prior to your next appointment, please contact your pharmacy*  Follow-Up: Call back or seek an in-person evaluation if the symptoms worsen or if the condition fails to improve as anticipated.    If you have been instructed to have an in-person evaluation today at a local Urgent Care facility, please use the link below. It will take you to a list of all of our available [facility], including address, phone number and hours of operation. Please do not delay care.  [facility]  If you or a family member do not have a primary care provider, use the link below to schedule a visit and establish care. When you choose a [facility] primary care physician or advanced practice provider, you gain a long-term partner in health. Find a Primary Care Provider  Learn  more about [facility] in-office and virtual care options: [facility]

## 2020-09-16 DIAGNOSIS — F4322 Adjustment disorder with anxiety: Secondary | ICD-10-CM | POA: Diagnosis not present

## 2020-09-30 DIAGNOSIS — M79642 Pain in left hand: Secondary | ICD-10-CM | POA: Diagnosis not present

## 2020-10-15 DIAGNOSIS — M79642 Pain in left hand: Secondary | ICD-10-CM | POA: Diagnosis not present

## 2020-10-20 ENCOUNTER — Encounter: Payer: Self-pay | Admitting: Family Medicine

## 2020-10-20 ENCOUNTER — Ambulatory Visit (INDEPENDENT_AMBULATORY_CARE_PROVIDER_SITE_OTHER): Payer: 59 | Admitting: Family Medicine

## 2020-10-20 ENCOUNTER — Other Ambulatory Visit (HOSPITAL_COMMUNITY)
Admission: RE | Admit: 2020-10-20 | Discharge: 2020-10-20 | Disposition: A | Discharge status: Home / Self Care | Payer: 59 | Source: Ambulatory Visit | Attending: Family Medicine | Admitting: Family Medicine

## 2020-10-20 ENCOUNTER — Other Ambulatory Visit: Payer: Self-pay

## 2020-10-20 VITALS — BP 115/74 | HR 62 | Temp 98.6°F | Ht 66.0 in | Wt 196.0 lb

## 2020-10-20 DIAGNOSIS — Z0001 Encounter for general adult medical examination with abnormal findings: Secondary | ICD-10-CM | POA: Diagnosis not present

## 2020-10-20 DIAGNOSIS — Z113 Encounter for screening for infections with a predominantly sexual mode of transmission: Secondary | ICD-10-CM | POA: Insufficient documentation

## 2020-10-20 DIAGNOSIS — I10 Essential (primary) hypertension: Secondary | ICD-10-CM

## 2020-10-20 DIAGNOSIS — G43809 Other migraine, not intractable, without status migrainosus: Secondary | ICD-10-CM | POA: Diagnosis not present

## 2020-10-20 DIAGNOSIS — J4520 Mild intermittent asthma, uncomplicated: Secondary | ICD-10-CM

## 2020-10-20 NOTE — Progress Notes (Signed)
Chief Complaint:  Max Weeks is a 35 y.o. male who presents today for his annual comprehensive physical exam.    Assessment/Plan:  Chronic Problems Addressed Today: Migraine Overall stable.  No recent flares.  Mild intermittent asthma without complication On albuterol.  Doing well with this.  Essential hypertension At goal.  Continue amlodipine 5 mg daily.  Recently had labs done with doctors..  Please see his MyChart message.  Everything is within normal limits.  Preventative Healthcare: UTD on vaccines and labs.Will get blood work done today.  Will get STD checked.   Patient Counseling(The following topics were reviewed and/or handout was given):  -Nutrition: Stressed importance of moderation in sodium/caffeine intake, saturated fat and cholesterol, caloric balance, sufficient intake of fresh fruits, vegetables, and fiber.  -Stressed the importance of regular exercise.   -Substance Abuse: Discussed cessation/primary prevention of tobacco, alcohol, or other drug use; driving or other dangerous activities under the influence; availability of treatment for abuse.   -Injury prevention: Discussed safety belts, safety helmets, smoke detector, smoking near bedding or upholstery.   -Sexuality: Discussed sexually transmitted diseases, partner selection, use of condoms, avoidance of unintended pregnancy and contraceptive alternatives.   -Dental health: Discussed importance of regular tooth brushing, flossing, and dental visits.  -Health maintenance and immunizations reviewed. Please refer to Health maintenance section.  Return to care in 1 year for next preventative visit.     Subjective:  HPI:  He has no acute complaints today.   Lifestyle Diet: Balanced Exercise: Goes to gym regularly.   Depression screen PHQ 2/9 10/20/2020  Decreased Interest 0  Down, Depressed, Hopeless 0  PHQ - 2 Score 0  Altered sleeping -  Tired, decreased energy -  Change in appetite -  Feeling bad  or failure about yourself  -  Trouble concentrating -  Moving slowly or fidgety/restless -  Suicidal thoughts -  PHQ-9 Score -    Health Maintenance Due  Topic Date Due   TETANUS/TDAP  Never done   COVID-19 Vaccine (3 - Booster for Pfizer series) 07/22/2019     ROS: Per HPI, otherwise a complete review of systems was negative.   PMH:  The following were reviewed and entered/updated in epic: Past Medical History:  Diagnosis Date   Asthma    Hypertension    Patient Active Problem List   Diagnosis Date Noted   Migraine 03/10/2020   Essential hypertension 08/09/2017   Mild intermittent asthma without complication 08/09/2017   Past Surgical History:  Procedure Laterality Date   KNEE ARTHROSCOPY     meniscal tear - ju jitsu injury    Family History  Problem Relation Age of Onset   Hypertension Mother    Diabetes Maternal Grandmother    Hypertension Maternal Grandmother    Stroke Maternal Grandmother    COPD Maternal Grandfather     Medications- reviewed and updated Current Outpatient Medications  Medication Sig Dispense Refill   albuterol (VENTOLIN HFA) 108 (90 Base) MCG/ACT inhaler Inhale 2 puffs into the lungs every 4 (four) hours as needed for wheezing or shortness of breath. 18 g 3   amLODipine (NORVASC) 5 MG tablet TAKE 1 TABLET (5 MG TOTAL) BY MOUTH DAILY. 90 tablet 3   No current facility-administered medications for this visit.    Allergies-reviewed and updated No Known Allergies  Social History   Socioeconomic History   Marital status: Single    Spouse name: Not on file   Number of children: 0   Years of education:  Not on file   Highest education level: Not on file  Occupational History   Not on file  Tobacco Use   Smoking status: Never   Smokeless tobacco: Never  Substance and Sexual Activity   Alcohol use: Yes    Comment: Occasional   Drug use: Never   Sexual activity: Not on file  Other Topics Concern   Not on file  Social History  Narrative   Not on file   Social Determinants of Health   Financial Resource Strain: Not on file  Food Insecurity: Not on file  Transportation Needs: Not on file  Physical Activity: Not on file  Stress: Not on file  Social Connections: Not on file        Objective:  Physical Exam: BP 115/74   Pulse 62   Temp 98.6 F (37 C) (Temporal)   Ht 5\' 6"  (1.676 m)   Wt 196 lb (88.9 kg)   SpO2 96%   BMI 31.64 kg/m   Body mass index is 31.64 kg/m. Wt Readings from Last 3 Encounters:  10/20/20 196 lb (88.9 kg)  07/14/20 195 lb 12.8 oz (88.8 kg)  06/25/20 195 lb 12.8 oz (88.8 kg)   Gen: NAD, resting comfortably HEENT: TMs normal bilaterally. OP clear. No thyromegaly noted.  CV: RRR with no murmurs appreciated Pulm: NWOB, CTAB with no crackles, wheezes, or rhonchi GI: Normal bowel sounds present. Soft, Nontender, Nondistended. MSK: no edema, cyanosis, or clubbing noted Skin: warm, dry Neuro: CN2-12 grossly intact. Strength 5/5 in upper and lower extremities. Reflexes symmetric and intact bilaterally.  Psych: Normal affect and thought content      I,Savera Zaman,acting as a scribe for 08/25/20, MD.,have documented all relevant documentation on the behalf of Jacquiline Doe, MD,as directed by  Jacquiline Doe, MD while in the presence of Jacquiline Doe, MD.   I, Jacquiline Doe, MD, have reviewed all documentation for this visit. The documentation on 10/20/20 for the exam, diagnosis, procedures, and orders are all accurate and complete.  12/20/20. Katina Degree, MD 10/20/2020 2:25 PM

## 2020-10-20 NOTE — Assessment & Plan Note (Signed)
At goal.  Continue amlodipine 5 mg daily.  Recently had labs done with doctors..  Please see his MyChart message.  Everything is within normal limits.

## 2020-10-20 NOTE — Patient Instructions (Signed)
It was very nice to see you today!  We will check blood work and a urine sample.  No other changes today.  I will see you back in year for your next physical.  Come back to see me sooner if needed.  Take care, Dr Jimmey Ralph  PLEASE NOTE:  If you had any lab tests please let us know if you have not heard back within a few days. You may see your results on mychart before we have a chance to review them but we will give you a call once they are reviewed by [place]. If we ordered any referrals today, please let us know if you have not heard from their office within the next week.   Please try these tips to maintain a healthy lifestyle:  Eat at least 3 REAL meals and 1-2 snacks per day.  Aim for no more than 5 hours between eating.  If you eat breakfast, please do so within one hour of getting up.   Each meal should contain half fruits/vegetables, one quarter protein, and one quarter carbs (no bigger than a computer mouse)  Cut down on sweet beverages. This includes juice, soda, and sweet tea.   Drink at least 1 glass of water with each meal and aim for at least 8 glasses per day  Exercise at least 150 minutes every week.    Preventive Care 35-89 Years Old, Male Preventive care refers to lifestyle choices and visits with your health care provider that can promote health and wellness. This includes: A yearly physical exam. This is also called an annual wellness visit. Regular dental and eye exams. Immunizations. Screening for certain conditions. Healthy lifestyle choices, such as: Eating a healthy diet. Getting regular exercise. Not using drugs or products that contain nicotine and tobacco. Limiting alcohol use. What can I expect for my preventive care visit? Physical exam Your health care provider may check your: Height and weight. These may be used to calculate your BMI (body mass index). BMI is a measurement that tells if you are at a healthy weight. Heart rate and blood pressure. Body  temperature. Skin for abnormal spots. Counseling Your health care provider may ask you questions about your: Past medical problems. Family's medical history. Alcohol, tobacco, and drug use. Emotional well-being. Home life and relationship well-being. Sexual activity. Diet, exercise, and sleep habits. Work and work Astronomer. Access to firearms. What immunizations do I need? Vaccines are usually given at various ages, according to a schedule. Your health care provider will recommend vaccines for you based on your age, medical history, and lifestyle or other factors, such as travel or where you work. What tests do I need? Blood tests Lipid and cholesterol levels. These may be checked every 5 years starting at age 69. Hepatitis C test. Hepatitis B test. Screening  Diabetes screening. This is done by checking your blood sugar (glucose) after you have not eaten for a while (fasting). Genital exam to check for testicular cancer or hernias. STD (sexually transmitted disease) testing, if you are at risk. Talk with your health care provider about your test results, treatment options, and if necessary, the need for more tests. Follow these instructions at home: Eating and drinking  Eat a healthy diet that includes fresh fruits and vegetables, whole grains, lean protein, and low-fat dairy products. Drink enough fluid to keep your urine pale yellow. Take vitamin and mineral supplements as recommended by your health care provider. Do not drink alcohol if your health care provider tells  you not to drink. If you drink alcohol: Limit how much you have to 0-2 drinks a day. Be aware of how much alcohol is in your drink. In the [place] one drink equals one 12 oz bottle of beer (355 mL), one 5 oz glass of wine (148 mL), or one 1 oz glass of hard liquor (44 mL). Lifestyle Take daily care of your teeth and gums. Brush your teeth every morning and night with fluoride toothpaste. Floss one time each  day. Stay active. Exercise for at least 30 minutes 5 or more days each week. Do not use any products that contain nicotine or tobacco, such as cigarettes, e-cigarettes, and chewing tobacco. If you need help quitting, ask your health care provider. Do not use drugs. If you are sexually active, practice safe sex. Use a condom or other form of protection to prevent STIs (sexually transmitted infections). Find healthy ways to cope with stress, such as: Meditation, yoga, or listening to music. Journaling. Talking to a trusted person. Spending time with friends and family. Safety Always wear your seat belt while driving or riding in a vehicle. Do not drive: If you have been drinking alcohol. Do not ride with someone who has been drinking. When you are tired or distracted. While texting. Wear a helmet and other protective equipment during sports activities. If you have firearms in your house, make sure you follow all gun safety procedures. Seek help if you have been physically or sexually abused. What's next? Go to your health care provider once a year for an annual wellness visit. Ask your health care provider how often you should have your eyes and teeth checked. Stay up to date on all vaccines. This information is not intended to replace advice given to you by your health care provider. Make sure you discuss any questions you have with your health care provider. Document Revised: 04/10/2020 Document Reviewed: 01/25/2018 Elsevier Patient Education  2022 [employer].

## 2020-10-20 NOTE — Assessment & Plan Note (Signed)
On albuterol.  Doing well with this.

## 2020-10-20 NOTE — Assessment & Plan Note (Signed)
Overall stable. No recent flares.  

## 2020-10-21 ENCOUNTER — Encounter: Payer: 59 | Admitting: Family Medicine

## 2020-10-21 ENCOUNTER — Encounter: Payer: Self-pay | Admitting: Family Medicine

## 2020-10-21 LAB — RPR: RPR Ser Ql: NONREACTIVE

## 2020-10-21 LAB — HEPATITIS C ANTIBODY
Hepatitis C Ab: NONREACTIVE
SIGNAL TO CUT-OFF: 0.01 (ref <1.00)

## 2020-10-21 LAB — HIV ANTIBODY (ROUTINE TESTING W REFLEX): HIV 1&2 Ab, 4th Generation: NONREACTIVE

## 2020-10-22 LAB — URINE CYTOLOGY ANCILLARY ONLY
Chlamydia: NEGATIVE
Comment: NEGATIVE
Comment: NEGATIVE
Comment: NORMAL
Neisseria Gonorrhea: NEGATIVE
Trichomonas: NEGATIVE

## 2020-10-23 NOTE — Progress Notes (Signed)
Dr Lavone Neri interpretation of your lab work:  [employer]! Everything is NORMAL! We can check again next time you come in.    If you have any additional questions, please give [place] a call or send [place] a message through [place].  Take care, Dr Jimmey Ralph

## 2020-10-28 DIAGNOSIS — F4322 Adjustment disorder with anxiety: Secondary | ICD-10-CM | POA: Diagnosis not present

## 2020-11-16 DIAGNOSIS — F4322 Adjustment disorder with anxiety: Secondary | ICD-10-CM | POA: Diagnosis not present

## 2020-11-30 DIAGNOSIS — F4322 Adjustment disorder with anxiety: Secondary | ICD-10-CM | POA: Diagnosis not present

## 2020-12-23 DIAGNOSIS — F4322 Adjustment disorder with anxiety: Secondary | ICD-10-CM | POA: Diagnosis not present

## 2021-01-20 DIAGNOSIS — F4322 Adjustment disorder with anxiety: Secondary | ICD-10-CM | POA: Diagnosis not present

## 2021-01-21 ENCOUNTER — Other Ambulatory Visit (HOSPITAL_COMMUNITY): Payer: Self-pay

## 2021-01-21 ENCOUNTER — Other Ambulatory Visit: Payer: Self-pay | Admitting: Family Medicine

## 2021-01-21 MED ORDER — AMLODIPINE BESYLATE 5 MG PO TABS
ORAL_TABLET | Freq: Every day | ORAL | 3 refills | Status: DC
  Filled 2021-01-21: qty 90, 90d supply, fill #0
  Filled 2021-06-09: qty 90, 90d supply, fill #1

## 2021-03-03 DIAGNOSIS — F4322 Adjustment disorder with anxiety: Secondary | ICD-10-CM | POA: Diagnosis not present

## 2021-03-17 DIAGNOSIS — F4322 Adjustment disorder with anxiety: Secondary | ICD-10-CM | POA: Diagnosis not present

## 2021-04-14 DIAGNOSIS — F4322 Adjustment disorder with anxiety: Secondary | ICD-10-CM | POA: Diagnosis not present

## 2021-04-28 DIAGNOSIS — F4322 Adjustment disorder with anxiety: Secondary | ICD-10-CM | POA: Diagnosis not present

## 2021-05-17 ENCOUNTER — Encounter: Payer: Self-pay | Admitting: Family Medicine

## 2021-05-17 ENCOUNTER — Ambulatory Visit: Payer: 59 | Admitting: Family Medicine

## 2021-05-17 VITALS — BP 112/82 | HR 60 | Temp 98.1°F | Ht 66.0 in | Wt 208.0 lb

## 2021-05-17 DIAGNOSIS — R202 Paresthesia of skin: Secondary | ICD-10-CM | POA: Diagnosis not present

## 2021-05-17 NOTE — Patient Instructions (Signed)
Referral sent to [facility] ?

## 2021-05-17 NOTE — Progress Notes (Signed)
? ?  Subjective:  ? ? ? Patient ID: Max Weeks, male    DOB: [birth year], 36 y.o.   MRN: [id number] ? ?Chief Complaint  ?Patient presents with  ? Numbness  ?  Bilateral numbness in toes that started on Saturday ?  ? ? ?HPI ?Numbness toes.  Has been building a fence.  Woke up on 4/1 and toes numb B x great toes.  Intermitt intensity. Present when awakens in am.  Some relief when stands, but then returns.  R worse than L ?Yesterday, when put socks on(tight), and worse.  Some better when removed socks.  Has been on amlodipine long time.  Had been squatting down a lot while working on fence.   Has had some soreness lateral calves. No DM.  No edema.  Took ibu 2 days ago.  No back pain ? ?Hopitalist at [facility] ? ?Health Maintenance Due  ?Topic Date Due  ? TETANUS/TDAP  Never done  ? ? ?Past Medical History:  ?Diagnosis Date  ? Asthma   ? Hypertension   ? ? ?Past Surgical History:  ?Procedure Laterality Date  ? KNEE ARTHROSCOPY    ? meniscal tear - ju jitsu injury  ? ? ?Outpatient Medications Prior to Visit  ?Medication Sig Dispense Refill  ? albuterol (VENTOLIN HFA) 108 (90 Base) MCG/ACT inhaler Inhale 2 puffs into the lungs every 4 (four) hours as needed for wheezing or shortness of breath. 18 g 3  ? amLODipine (NORVASC) 5 MG tablet TAKE 1 TABLET (5 MG TOTAL) BY MOUTH DAILY. 90 tablet 3  ? ?No facility-administered medications prior to visit.  ? ? ?No Known Allergies ?ROS neg/noncontributory except as noted HPI/below ? ? ?   ?Objective:  ?  ? ?BP 112/82   Pulse 60   Temp 98.1 ?F (36.7 ?C) (Temporal)   Ht 5\' 6"  (1.676 m)   Wt 208 lb (94.3 kg)   SpO2 98%   BMI 33.57 kg/m?  ?Wt Readings from Last 3 Encounters:  ?05/17/21 208 lb (94.3 kg)  ?10/20/20 196 lb (88.9 kg)  ?07/14/20 195 lb 12.8 oz (88.8 kg)  ? ? ?Physical Exam  ? ?Gen: WDWN NAD ?HEENT: NCAT, conjunctiva not injected, sclera nonicteric ?EXT:  no edema ?MSK: no gross abnormalities.  No ttp feet/ankles/toes.  Good strength.  Good rom.  Dp 2+B.  No TTP back ?NEURO:  A&O x3.  CN II-XII intact.  ?PSYCH: normal mood. Good eye contact ? ?   ?Assessment & Plan:  ? ?Problem List Items Addressed This Visit   ?None ?Visit Diagnoses   ? ? Paresthesia    -  Primary  ? Relevant Orders  ? Ambulatory referral to Neurology  ? ?  ? ? Paresthesias feet after a lot of squatting buidling fence-suspect compressive neuropathy.   NSAID's.  Declines pred for now.  Will see Dr. Georgina Snell tomorrow at 8:45.  Requested referral to neuro(but he may cancel if better) ? ?No orders of the defined types were placed in this encounter. ? ? ?Wellington Hampshire, MD ? ?

## 2021-05-18 ENCOUNTER — Encounter: Payer: Self-pay | Admitting: Family Medicine

## 2021-05-18 ENCOUNTER — Encounter: Payer: Self-pay | Admitting: Neurology

## 2021-05-18 ENCOUNTER — Ambulatory Visit (INDEPENDENT_AMBULATORY_CARE_PROVIDER_SITE_OTHER): Payer: 59 | Admitting: Family Medicine

## 2021-05-18 VITALS — BP 102/78 | HR 60 | Ht 66.0 in | Wt 208.0 lb

## 2021-05-18 DIAGNOSIS — R202 Paresthesia of skin: Secondary | ICD-10-CM | POA: Diagnosis not present

## 2021-05-18 LAB — VITAMIN B12: Vitamin B-12: 322 pg/mL (ref 211–911)

## 2021-05-18 LAB — HEMOGLOBIN A1C: Hgb A1c MFr Bld: 5.9 % (ref 4.6–6.5)

## 2021-05-18 LAB — TSH: TSH: 2.72 u[IU]/mL (ref 0.35–5.50)

## 2021-05-18 LAB — SEDIMENTATION RATE: Sed Rate: 18 mm/hr — ABNORMAL HIGH (ref 0–15)

## 2021-05-18 NOTE — Patient Instructions (Addendum)
Good to see you today. ? ?Please get labs today before you leave. ?  ?Follow-up: as needed ?

## 2021-05-18 NOTE — Progress Notes (Signed)
? ?I, Max Weeks, LAT, ATC, am serving as scribe for Dr. Clementeen Graham. ? ?Max Weeks is a 36 y.o. male who presents to [facility] today for numbness in B toes, R>L, since 05/15/21.  He recalls having built a fence over the last month and was doing a lot of squatting.   He was seen by Primary Care on 05/17/21 w/ these c/o and was referred to neurology.  He locates his symptoms to mainly to his B 2nd toes, R>L.  His symptoms seem the worse in the morning.  He also reports B lower leg/calf tightness. ? ?Low back pain: no ?Swelling: no ?Aggravating factors: worse first thing in the morning ?Treatments tried: IBU ? ? ?Pertinent review of systems: no fever or chills. Positive for weight gain.  ? ?Relevant historical information: HTN ? ? ?Exam:  ?BP 102/78 (BP Location: Right Arm, Patient Position: Sitting, Cuff Size: Normal)   Pulse 60   Ht 5\' 6"  (1.676 m)   Wt 208 lb (94.3 kg)   SpO2 95%   BMI 33.57 kg/m?  ?General: Well Developed, well nourished, and in no acute distress.  ? ?MSK: L-spine: Nontender.  Normal motion. ?Lower extremity strength is intact. ?Sensation is intact to light touch. ?Reflexes are intact. ?Strength is intact. ? ?Feet bilaterally normal. ?Paresthesias minimally reproducible with metatarsal squeeze. ?Normal foot and ankle motion and strength. ? ? ? ? ?Assessment and Plan: ?36 y.o. male with bilateral toe paresthesias most dominant at toes 2-4 bilaterally. ?This occurred after 31 did a lot of loading on his forefeet while squatting to build a wooden fence over the last month.  I think the most likely explanation is that he has a compressive nerve irritation of the digital nerves at the metatarsal heads.  An atypical presentation of L5 lumbar radiculopathy bilaterally is also a possibility. ?Plan for a limited metabolic work-up listed below and will try metatarsal pads and changing his activity.  Recommend instead of squatting that he uses a stool or a  bench.  He is already started this intervention.  Plan for a bit of watchful waiting.  If not improving consider nerve conduction study and lumbar radiculopathy work-up.  He will let me know how it goes and we certainly can proceed with further evaluation into the future. ? ? ?PDMP not reviewed this encounter. ?Orders Placed This Encounter  ?Procedures  ? Vitamin B12  ?  Standing Status:   Future  ?  Number of Occurrences:   1  ?  Standing Expiration Date:   11/17/2021  ? TSH  ?  Standing Status:   Future  ?  Number of Occurrences:   1  ?  Standing Expiration Date:   11/17/2021  ? COMPLETE METABOLIC PANEL WITH GFR  ?  Standing Status:   Future  ?  Number of Occurrences:   1  ?  Standing Expiration Date:   11/17/2021  ? Sedimentation rate  ?  Standing Status:   Future  ?  Number of Occurrences:   1  ?  Standing Expiration Date:   11/17/2021  ? HgB A1c  ?  Standing Status:   Future  ?  Number of Occurrences:   1  ?  Standing Expiration Date:   05/19/2022  ? ?No orders of the defined types were placed in this encounter. ? ? ? ?Discussed warning signs or symptoms. Please see discharge instructions. Patient expresses understanding. ? ? ?The above documentation has been reviewed and  is accurate and complete Lynne Leader, M.D. ? ? ?

## 2021-05-19 LAB — COMPLETE METABOLIC PANEL WITH GFR
AG Ratio: 1.5 (calc) (ref 1.0–2.5)
ALT: 31 U/L (ref 9–46)
AST: 26 U/L (ref 10–40)
Albumin: 4.1 g/dL (ref 3.6–5.1)
Alkaline phosphatase (APISO): 61 U/L (ref 36–130)
BUN: 13 mg/dL (ref 7–25)
CO2: 24 mmol/L (ref 20–32)
Calcium: 8.8 mg/dL (ref 8.6–10.3)
Chloride: 106 mmol/L (ref 98–110)
Creat: 0.98 mg/dL (ref 0.60–1.26)
Globulin: 2.8 g/dL (calc) (ref 1.9–3.7)
Glucose, Bld: 106 mg/dL — ABNORMAL HIGH (ref 65–99)
Potassium: 4 mmol/L (ref 3.5–5.3)
Sodium: 139 mmol/L (ref 135–146)
Total Bilirubin: 0.4 mg/dL (ref 0.2–1.2)
Total Protein: 6.9 g/dL (ref 6.1–8.1)
eGFR: 103 mL/min/{1.73_m2} (ref >=60)

## 2021-05-19 NOTE — Progress Notes (Signed)
Sed rate is minimally elevated.  Blood sugar is minimally elevated but I do not think you are fasting for these labs.  B12 is normal.

## 2021-05-24 ENCOUNTER — Other Ambulatory Visit (HOSPITAL_COMMUNITY): Payer: Self-pay

## 2021-05-24 ENCOUNTER — Encounter: Payer: Self-pay | Admitting: Family Medicine

## 2021-05-24 MED ORDER — METHYLPREDNISOLONE 4 MG PO TBPK
ORAL_TABLET | ORAL | 0 refills | Status: DC
  Filled 2021-05-24: qty 21, 6d supply, fill #0

## 2021-05-26 DIAGNOSIS — F4322 Adjustment disorder with anxiety: Secondary | ICD-10-CM | POA: Diagnosis not present

## 2021-06-01 ENCOUNTER — Other Ambulatory Visit: Payer: Self-pay | Admitting: Student

## 2021-06-01 ENCOUNTER — Other Ambulatory Visit (HOSPITAL_COMMUNITY): Payer: Self-pay | Admitting: Student

## 2021-06-01 DIAGNOSIS — M546 Pain in thoracic spine: Secondary | ICD-10-CM | POA: Diagnosis not present

## 2021-06-01 DIAGNOSIS — R200 Anesthesia of skin: Secondary | ICD-10-CM | POA: Diagnosis not present

## 2021-06-01 DIAGNOSIS — M545 Low back pain, unspecified: Secondary | ICD-10-CM | POA: Diagnosis not present

## 2021-06-01 DIAGNOSIS — R202 Paresthesia of skin: Secondary | ICD-10-CM | POA: Diagnosis not present

## 2021-06-01 DIAGNOSIS — M549 Dorsalgia, unspecified: Secondary | ICD-10-CM | POA: Diagnosis not present

## 2021-06-01 DIAGNOSIS — M419 Scoliosis, unspecified: Secondary | ICD-10-CM | POA: Diagnosis not present

## 2021-06-03 ENCOUNTER — Other Ambulatory Visit (HOSPITAL_COMMUNITY): Payer: Self-pay

## 2021-06-03 MED ORDER — CYCLOBENZAPRINE HCL 5 MG PO TABS
ORAL_TABLET | ORAL | 1 refills | Status: DC
  Filled 2021-06-03: qty 60, 20d supply, fill #0

## 2021-06-07 ENCOUNTER — Encounter (HOSPITAL_COMMUNITY): Payer: Self-pay

## 2021-06-07 ENCOUNTER — Other Ambulatory Visit (HOSPITAL_COMMUNITY): Payer: 59

## 2021-06-09 ENCOUNTER — Other Ambulatory Visit (HOSPITAL_COMMUNITY): Payer: Self-pay

## 2021-06-09 DIAGNOSIS — M549 Dorsalgia, unspecified: Secondary | ICD-10-CM | POA: Diagnosis not present

## 2021-06-15 DIAGNOSIS — M549 Dorsalgia, unspecified: Secondary | ICD-10-CM | POA: Diagnosis not present

## 2021-06-23 DIAGNOSIS — M549 Dorsalgia, unspecified: Secondary | ICD-10-CM | POA: Diagnosis not present

## 2021-06-23 DIAGNOSIS — F4322 Adjustment disorder with anxiety: Secondary | ICD-10-CM | POA: Diagnosis not present

## 2021-06-29 DIAGNOSIS — M549 Dorsalgia, unspecified: Secondary | ICD-10-CM | POA: Diagnosis not present

## 2021-07-13 DIAGNOSIS — M549 Dorsalgia, unspecified: Secondary | ICD-10-CM | POA: Diagnosis not present

## 2021-07-21 DIAGNOSIS — F4322 Adjustment disorder with anxiety: Secondary | ICD-10-CM | POA: Diagnosis not present

## 2021-08-11 IMAGING — DX DG WRIST COMPLETE 3+V*R*
4 series · 4 of 4 positions shown · non-contrast
Comparison: None.

CLINICAL DATA: Chronic pain

EXAM:
RIGHT WRIST - COMPLETE 3+ VIEW

[wrist ap]
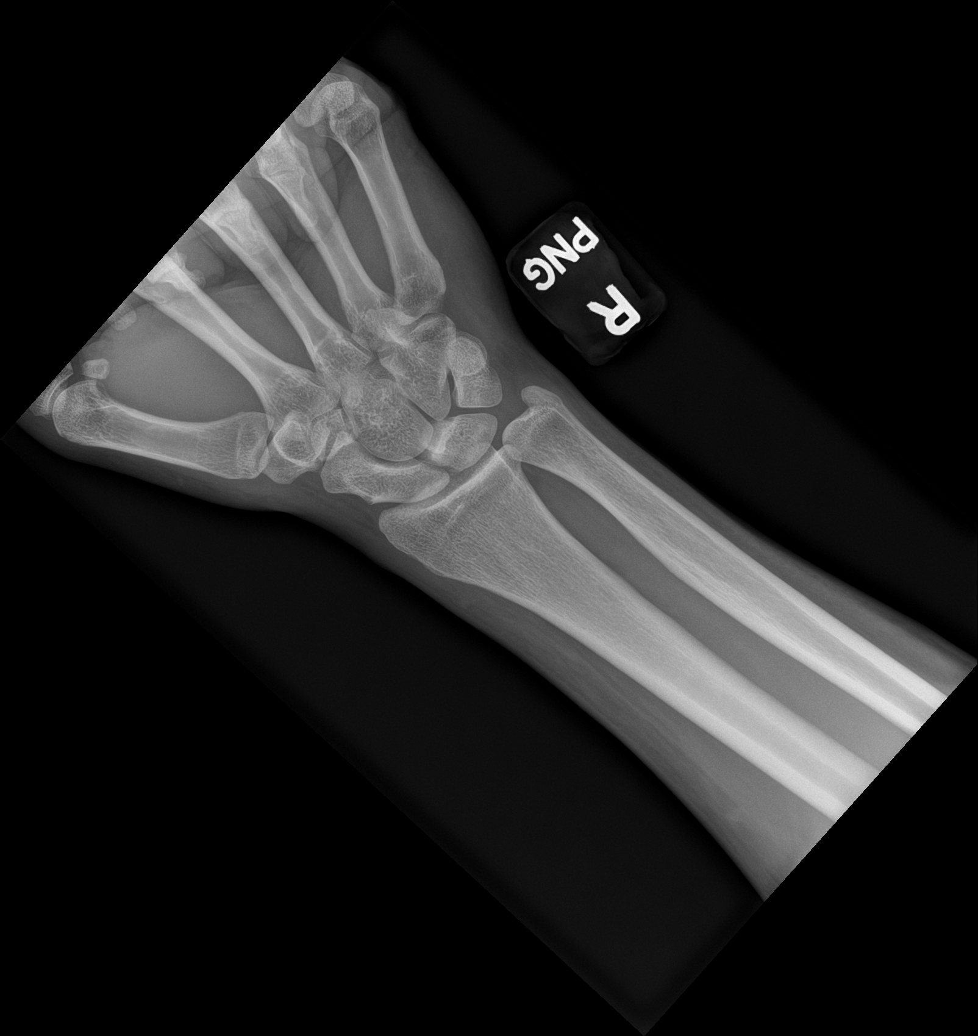

[wrist obl]
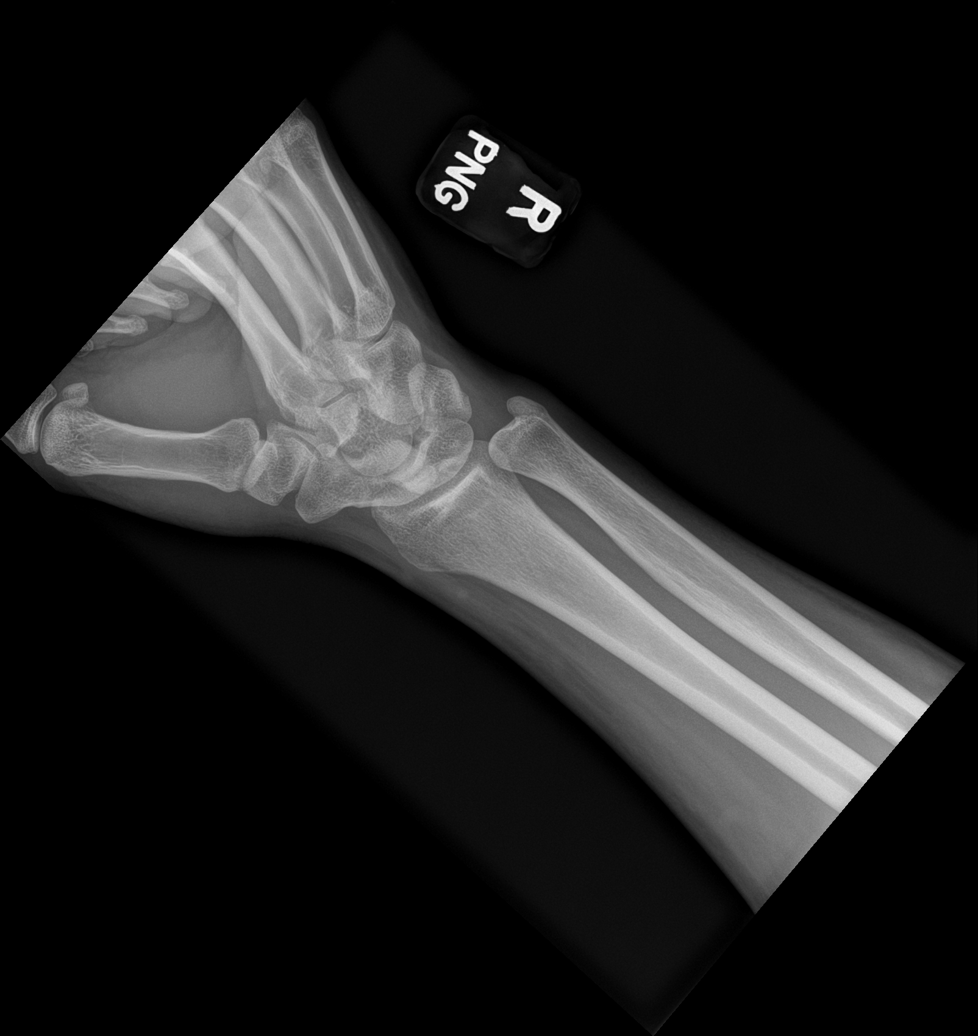

[wrist lat]
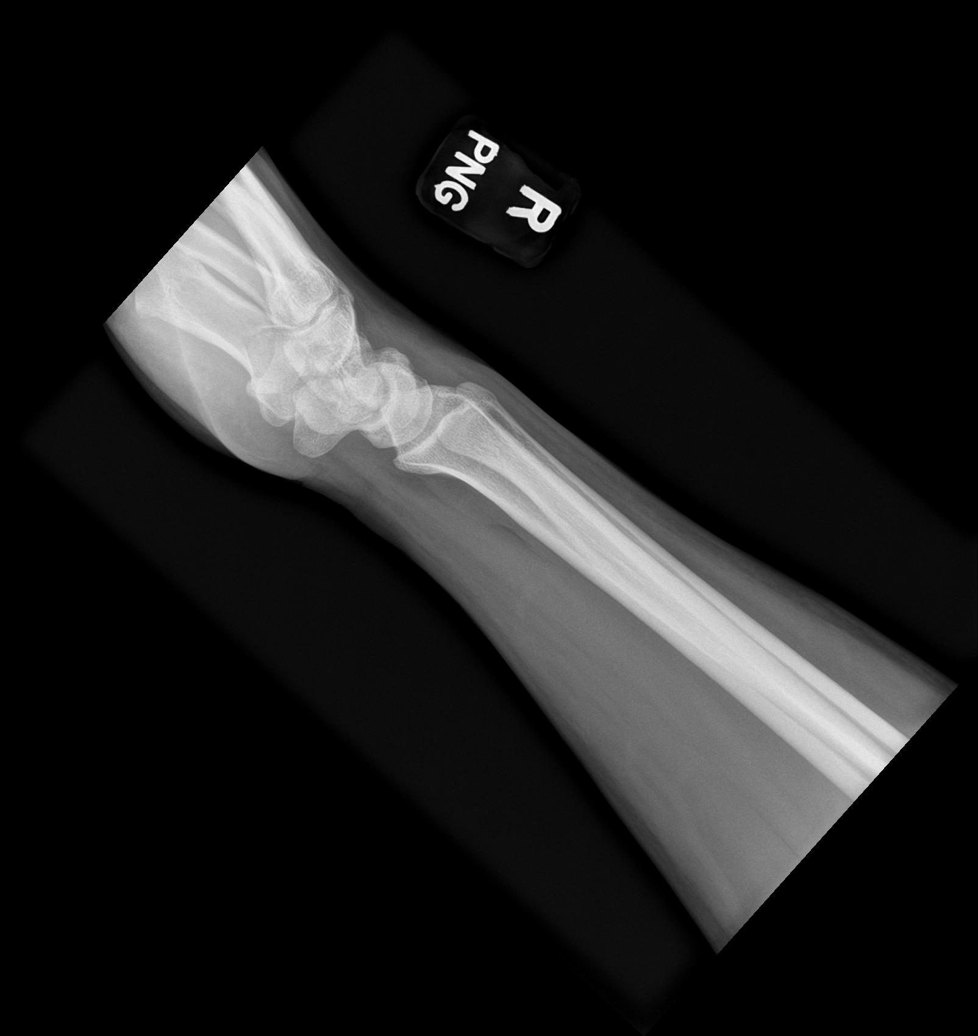

[scaphoid]
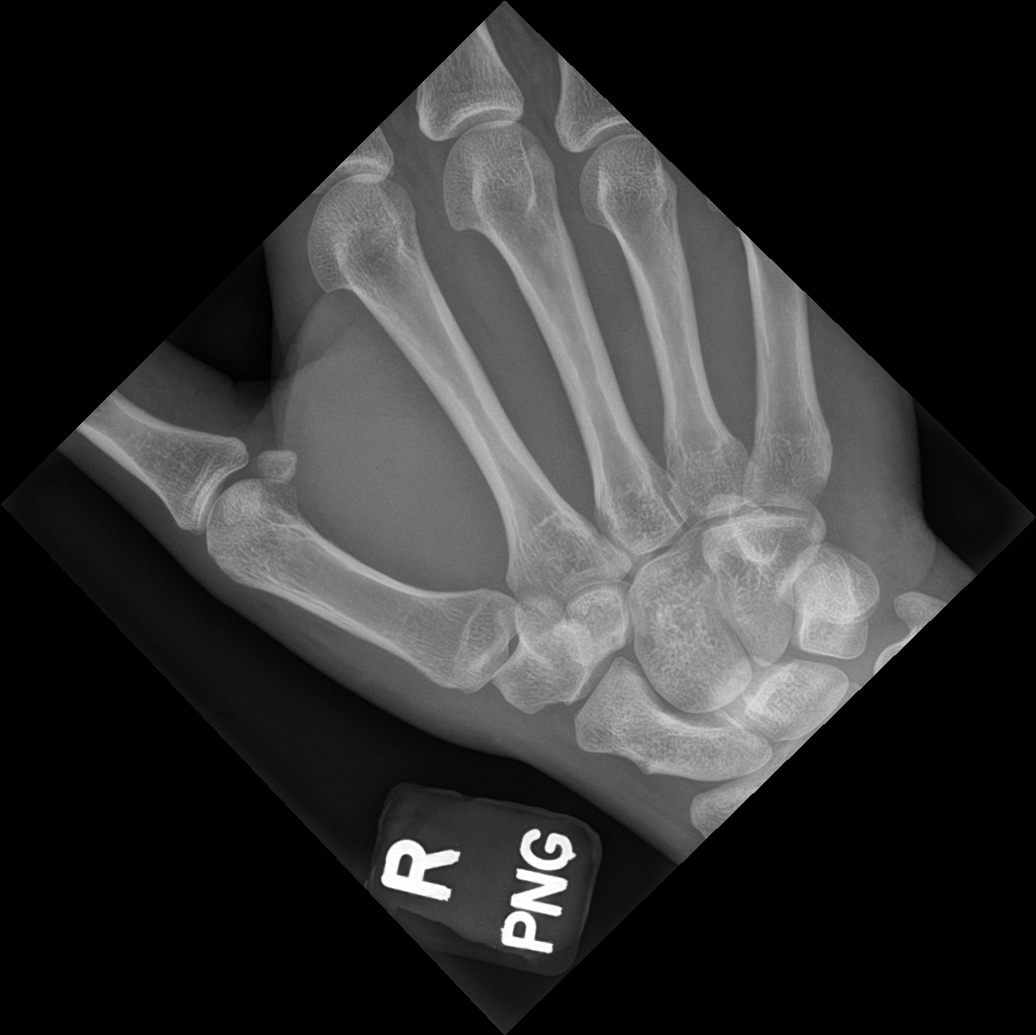

[4 of 4 positions shown; findings below may reference images not displayed]

FINDINGS: There is no evidence of fracture or dislocation. There is no
evidence of arthropathy or other focal bone abnormality. Soft
tissues are unremarkable.
IMPRESSION: Negative.

## 2021-09-29 DIAGNOSIS — F4322 Adjustment disorder with anxiety: Secondary | ICD-10-CM | POA: Diagnosis not present

## 2021-10-13 DIAGNOSIS — F4322 Adjustment disorder with anxiety: Secondary | ICD-10-CM | POA: Diagnosis not present

## 2021-10-27 ENCOUNTER — Encounter: Payer: Self-pay | Admitting: Family Medicine

## 2021-10-27 ENCOUNTER — Other Ambulatory Visit (HOSPITAL_COMMUNITY): Payer: Self-pay

## 2021-10-27 ENCOUNTER — Ambulatory Visit (INDEPENDENT_AMBULATORY_CARE_PROVIDER_SITE_OTHER): Payer: 59 | Admitting: Family Medicine

## 2021-10-27 VITALS — BP 119/78 | HR 61 | Temp 97.7°F | Ht 66.0 in | Wt 186.2 lb

## 2021-10-27 DIAGNOSIS — Z13220 Encounter for screening for lipoid disorders: Secondary | ICD-10-CM

## 2021-10-27 DIAGNOSIS — J029 Acute pharyngitis, unspecified: Secondary | ICD-10-CM

## 2021-10-27 DIAGNOSIS — R202 Paresthesia of skin: Secondary | ICD-10-CM | POA: Diagnosis not present

## 2021-10-27 DIAGNOSIS — R739 Hyperglycemia, unspecified: Secondary | ICD-10-CM

## 2021-10-27 DIAGNOSIS — I10 Essential (primary) hypertension: Secondary | ICD-10-CM

## 2021-10-27 DIAGNOSIS — Z0001 Encounter for general adult medical examination with abnormal findings: Secondary | ICD-10-CM

## 2021-10-27 LAB — CBC
HCT: 41 % (ref 39.0–52.0)
Hemoglobin: 13.5 g/dL (ref 13.0–17.0)
MCHC: 32.8 g/dL (ref 30.0–36.0)
MCV: 86.5 fl (ref 78.0–100.0)
Platelets: 242 10*3/uL (ref 150.0–400.0)
RBC: 4.75 Mil/uL (ref 4.22–5.81)
RDW: 14.6 % (ref 11.5–15.5)
WBC: 3.1 10*3/uL — ABNORMAL LOW (ref 4.0–10.5)

## 2021-10-27 LAB — LIPID PANEL
Cholesterol: 159 mg/dL (ref 0–200)
HDL: 45.6 mg/dL (ref >39.00)
LDL Cholesterol: 105 mg/dL — ABNORMAL HIGH (ref 0–99)
NonHDL: 113.08
Total CHOL/HDL Ratio: 3
Triglycerides: 42 mg/dL (ref 0.0–149.0)
VLDL: 8.4 mg/dL (ref 0.0–40.0)

## 2021-10-27 LAB — COMPREHENSIVE METABOLIC PANEL
ALT: 12 U/L (ref 0–53)
AST: 20 U/L (ref 0–37)
Albumin: 3.8 g/dL (ref 3.5–5.2)
Alkaline Phosphatase: 49 U/L (ref 39–117)
BUN: 14 mg/dL (ref 6–23)
CO2: 25 mEq/L (ref 19–32)
Calcium: 9 mg/dL (ref 8.4–10.5)
Chloride: 106 mEq/L (ref 96–112)
Creatinine, Ser: 1.01 mg/dL (ref 0.40–1.50)
GFR: 96.17 mL/min (ref >60.00)
Glucose, Bld: 83 mg/dL (ref 70–99)
Potassium: 4.1 mEq/L (ref 3.5–5.1)
Sodium: 139 mEq/L (ref 135–145)
Total Bilirubin: 0.7 mg/dL (ref 0.2–1.2)
Total Protein: 7 g/dL (ref 6.0–8.3)

## 2021-10-27 LAB — TSH: TSH: 3.19 u[IU]/mL (ref 0.35–5.50)

## 2021-10-27 LAB — HEMOGLOBIN A1C: Hgb A1c MFr Bld: 5.9 % (ref 4.6–6.5)

## 2021-10-27 LAB — POC COVID19 BINAXNOW: SARS Coronavirus 2 Ag: NEGATIVE

## 2021-10-27 LAB — POCT RAPID STREP A (OFFICE): Rapid Strep A Screen: NEGATIVE

## 2021-10-27 LAB — VITAMIN B12: Vitamin B-12: 318 pg/mL (ref 211–911)

## 2021-10-27 MED ORDER — AMLODIPINE BESYLATE 5 MG PO TABS
5.0000 mg | ORAL_TABLET | Freq: Every day | ORAL | 3 refills | Status: DC
  Filled 2021-10-27 – 2022-01-20 (×3): qty 90, 90d supply, fill #0
  Filled 2022-04-29: qty 90, 90d supply, fill #1

## 2021-10-27 MED ORDER — ALBUTEROL SULFATE HFA 108 (90 BASE) MCG/ACT IN AERS
2.0000 | INHALATION_SPRAY | RESPIRATORY_TRACT | 3 refills | Status: DC | PRN
  Filled 2021-10-27 – 2022-01-10 (×2): qty 6.7, 17d supply, fill #0
  Filled 2022-01-20: qty 6.7, 16d supply, fill #0

## 2021-10-27 NOTE — Assessment & Plan Note (Signed)
He has been seen by sports medicine for this about 5 months ago.  His symptoms are improving.  He has also been seen by neurosurgery and had negative T and L-spine.  We will be following up with neurology next week and may be getting nerve conduction study.  Did have labs that showed low normal B12 several months ago.  We will recheck today along with a methylmalonic acid.

## 2021-10-27 NOTE — Patient Instructions (Signed)
It was very nice to see you today!  We will check blood work today.  Please keep up the good work with diet and exercise.  I will see back in year for your next physical.  Come back sooner if needed.  Take care, Dr Jimmey Ralph  PLEASE NOTE:  If you had any lab tests please let us know if you have not heard back within a few days. You may see your results on mychart before we have a chance to review them but we will give you a call once they are reviewed by [place]. If we ordered any referrals today, please let us know if you have not heard from their office within the next week.   Please try these tips to maintain a healthy lifestyle:  Eat at least 3 REAL meals and 1-2 snacks per day.  Aim for no more than 5 hours between eating.  If you eat breakfast, please do so within one hour of getting up.   Each meal should contain half fruits/vegetables, one quarter protein, and one quarter carbs (no bigger than a computer mouse)  Cut down on sweet beverages. This includes juice, soda, and sweet tea.   Drink at least 1 glass of water with each meal and aim for at least 8 glasses per day  Exercise at least 150 minutes every week.    Preventive Care 46-61 Years Old, Male Preventive care refers to lifestyle choices and visits with your health care provider that can promote health and wellness. Preventive care visits are also called wellness exams. What can I expect for my preventive care visit? Counseling During your preventive care visit, your health care provider may ask about your: Medical history, including: Past medical problems. Family medical history. Current health, including: Emotional well-being. Home life and relationship well-being. Sexual activity. Lifestyle, including: Alcohol, nicotine or tobacco, and drug use. Access to firearms. Diet, exercise, and sleep habits. Safety issues such as seatbelt and bike helmet use. Sunscreen use. Work and work Astronomer. Physical exam Your  health care provider may check your: Height and weight. These may be used to calculate your BMI (body mass index). BMI is a measurement that tells if you are at a healthy weight. Waist circumference. This measures the distance around your waistline. This measurement also tells if you are at a healthy weight and may help predict your risk of certain diseases, such as type 2 diabetes and high blood pressure. Heart rate and blood pressure. Body temperature. Skin for abnormal spots. What immunizations do I need?  Vaccines are usually given at various ages, according to a schedule. Your health care provider will recommend vaccines for you based on your age, medical history, and lifestyle or other factors, such as travel or where you work. What tests do I need? Screening Your health care provider may recommend screening tests for certain conditions. This may include: Lipid and cholesterol levels. Diabetes screening. This is done by checking your blood sugar (glucose) after you have not eaten for a while (fasting). Hepatitis B test. Hepatitis C test. HIV (human immunodeficiency virus) test. STI (sexually transmitted infection) testing, if you are at risk. Talk with your health care provider about your test results, treatment options, and if necessary, the need for more tests. Follow these instructions at home: Eating and drinking  Eat a healthy diet that includes fresh fruits and vegetables, whole grains, lean protein, and low-fat dairy products. Drink enough fluid to keep your urine pale yellow. Take vitamin and mineral supplements  as recommended by your health care provider. Do not drink alcohol if your health care provider tells you not to drink. If you drink alcohol: Limit how much you have to 0-2 drinks a day. Know how much alcohol is in your drink. In the [place] one drink equals one 12 oz bottle of beer (355 mL), one 5 oz glass of wine (148 mL), or one 1 oz glass of hard liquor (44  mL). Lifestyle Brush your teeth every morning and night with fluoride toothpaste. Floss one time each day. Exercise for at least 30 minutes 5 or more days each week. Do not use any products that contain nicotine or tobacco. These products include cigarettes, chewing tobacco, and vaping devices, such as e-cigarettes. If you need help quitting, ask your health care provider. Do not use drugs. If you are sexually active, practice safe sex. Use a condom or other form of protection to prevent STIs. Find healthy ways to manage stress, such as: Meditation, yoga, or listening to music. Journaling. Talking to a trusted person. Spending time with friends and family. Minimize exposure to UV radiation to reduce your risk of skin cancer. Safety Always wear your seat belt while driving or riding in a vehicle. Do not drive: If you have been drinking alcohol. Do not ride with someone who has been drinking. If you have been using any mind-altering substances or drugs. While texting. When you are tired or distracted. Wear a helmet and other protective equipment during sports activities. If you have firearms in your house, make sure you follow all gun safety procedures. Seek help if you have been physically or sexually abused. What's next? Go to your health care provider once a year for an annual wellness visit. Ask your health care provider how often you should have your eyes and teeth checked. Stay up to date on all vaccines. This information is not intended to replace advice given to you by your health care provider. Make sure you discuss any questions you have with your health care provider. Document Revised: 07/29/2020 Document Reviewed: 07/29/2020 Elsevier Patient Education  2023 [employer].

## 2021-10-27 NOTE — Progress Notes (Signed)
Chief Complaint:  Max Weeks is a 36 y.o. male who presents today for his annual comprehensive physical exam.    Assessment/Plan:  New/Acute Problems: Sore Throat Likely viral URI.  Covid and strep negative.  Continue conservative measures.  Encouraged hydration.  He will let us know if symptoms worsen.  Chronic Problems Addressed Today: Paresthesia He has been seen by sports medicine for this about 5 months ago.  His symptoms are improving.  He has also been seen by neurosurgery and had negative T and L-spine.  We will be following up with neurology next week and may be getting nerve conduction study.  Did have labs that showed low normal B12 several months ago.  We will recheck today along with a methylmalonic acid.  Essential hypertension Blood pressure at goal on amlodipine 5 mg daily.  Will refill today.  Preventative Healthcare: Check labs.  Get flu vaccine through work.  Patient Counseling(The following topics were reviewed and/or handout was given):  -Nutrition: Stressed importance of moderation in sodium/caffeine intake, saturated fat and cholesterol, caloric balance, sufficient intake of fresh fruits, vegetables, and fiber.  -Stressed the importance of regular exercise.   -Substance Abuse: Discussed cessation/primary prevention of tobacco, alcohol, or other drug use; driving or other dangerous activities under the influence; availability of treatment for abuse.   -Injury prevention: Discussed safety belts, safety helmets, smoke detector, smoking near bedding or upholstery.   -Sexuality: Discussed sexually transmitted diseases, partner selection, use of condoms, avoidance of unintended pregnancy and contraceptive alternatives.   -Dental health: Discussed importance of regular tooth brushing, flossing, and dental visits.  -Health maintenance and immunizations reviewed. Please refer to Health maintenance section.  Return to care in 1 year for next preventative visit.      Subjective:  HPI:  He has no acute complaints today. See A/p for status of chronic conditions.   Still sore throat for the last couple days.  No clear sick contacts.  Home COVID test was negative twice.  Some malaise.  No fevers or chills.  Lifestyle Diet: Balanced.  Exercise: Goes      10/27/2021    8:14 AM  Depression screen PHQ 2/9  Decreased Interest 0  Down, Depressed, Hopeless 0  PHQ - 2 Score 0    Health Maintenance Due  Topic Date Due   COVID-19 Vaccine (3 - Pfizer series) 04/18/2019     ROS: Per HPI, otherwise a complete review of systems was negative.   PMH:  The following were reviewed and entered/updated in epic: Past Medical History:  Diagnosis Date   Asthma    Hypertension    Patient Active Problem List   Diagnosis Date Noted   Paresthesia 10/27/2021   Migraine 03/10/2020   Essential hypertension 08/09/2017   Mild intermittent asthma without complication 08/09/2017   Past Surgical History:  Procedure Laterality Date   KNEE ARTHROSCOPY     meniscal tear - ju jitsu injury    Family History  Problem Relation Age of Onset   Hypertension Mother    Diabetes Maternal Grandmother    Hypertension Maternal Grandmother    Stroke Maternal Grandmother    COPD Maternal Grandfather     Medications- reviewed and updated Current Outpatient Medications  Medication Sig Dispense Refill   cyclobenzaprine (FLEXERIL) 5 MG tablet Take 1 tablet by mouth 3 times every day as needed for muscle spasm 60 tablet 1   albuterol (VENTOLIN HFA) 108 (90 Base) MCG/ACT inhaler Inhale 2 puffs into the lungs every 4 (  four) hours as needed for wheezing or shortness of breath. 6.7 g 3   amLODipine (NORVASC) 5 MG tablet TAKE 1 TABLET (5 MG TOTAL) BY MOUTH DAILY. 90 tablet 3   No current facility-administered medications for this visit.    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 Wt Readings from Last 3 Encounters:  10/27/21 186 lb 3.2 oz (84.5 kg)  05/18/21 208 lb (94.3 kg)  05/17/21 208 lb (94.3 kg)   Gen: NAD, resting comfortably HEENT: TMs normal bilaterally. OP clear. No thyromegaly noted.  CV: RRR with no murmurs appreciated Pulm: NWOB, CTAB with no crackles, wheezes, or rhonchi GI: Normal bowel sounds present. Soft, Nontender, Nondistended. MSK: no edema, cyanosis, or clubbing noted Skin: warm, dry Neuro: CN2-12 grossly intact. Strength 5/5 in upper and lower extremities. Reflexes symmetric and intact bilaterally.  Psych: Normal affect and thought content     Lynden Carrithers M. 07/17/21, MD 10/27/2021 9:00 AM

## 2021-10-27 NOTE — Assessment & Plan Note (Signed)
Blood pressure at goal on amlodipine 5 mg daily.  Will refill today.

## 2021-10-29 NOTE — Progress Notes (Unsigned)
[facility] Neurology [facility] Clinic Note - Initial Visit   Date: 11/01/2021   Max Weeks MRN: [id number] DOB: 03-Jul-1985   Dear Dr. Jimmey Ralph:  Thank you for your kind referral of Max Weeks for consultation of bilateral toe numbness. Although his history is well known to you, please allow [place] to reiterate it for the purpose of our medical record. The patient was accompanied to the clinic by self.    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 He works as a Licensed conveyancer at [employer].  IMPRESSION/PLAN: Toe numbness, suggestive of neuroma possibly as a result of repetitive compression of the digital nerves from squatting.  Symptoms are now only present with weight bearing.  No evidence of peripheral neuropathy or lumbosacral radiculopathy.  Fortunately, symptoms are improving, so I suggest that he continue to monitor.  If symptoms get worse, we can consider nerve testing of the legs.  Return to clinic as needed  ------------------------------------------------------------- History of present illness: Starting on May 15, 2021, he recalls having numbness involving the 2nd toes in both feet, worse on the left.  Symptoms were waxing and waning and worse with prolonged sitting.  Occasionally, he would have numbness involving the heel.  He had been building a fence a month prior to symptom onset and tells me that he was squatting a lot. He endorses having back spasms, soreness in the buttocks, and episodic paresthesia over the low back.    MRI thoracic and lumbar spine was performed at [facility] and was normal.  He saw Dr. Yetta Barre who started him on a steroid taper.  He has completed PT which also helped.  Over the past few months, his symptoms have largely resolved.  He only notices numbness of the toes now when he is exercises, specifically with excessive load on his feet.   He denies  weakness or radicular leg pain.    Out-side paper records, electronic medical record, and images have been reviewed where available and summarized as:  Lab Results  Component Value Date   HGBA1C 5.9 10/27/2021   Lab Results  Component Value Date   VITAMINB12 318 10/27/2021   Lab Results  Component Value Date   TSH 3.19 10/27/2021   Lab Results  Component Value Date   ESRSEDRATE 18 (H) 05/18/2021    Past Medical History:  Diagnosis Date   Asthma    Hypertension     Past Surgical History:  Procedure Laterality Date   KNEE ARTHROSCOPY     meniscal tear - ju jitsu injury     Medications:  Outpatient Encounter Medications as of 11/01/2021  Medication Sig   albuterol (VENTOLIN HFA) 108 (90 Base) MCG/ACT inhaler Inhale 2 puffs into the lungs every 4 (four) hours as needed for wheezing or shortness of breath.   amLODipine (NORVASC) 5 MG tablet TAKE 1 TABLET (5 MG TOTAL) BY MOUTH DAILY.   cyclobenzaprine (FLEXERIL) 5 MG tablet Take 1 tablet by mouth 3 times every day as needed for muscle spasm   No facility-administered encounter medications on file as of 11/01/2021.    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  Speech is not dysarthric.  CRANIAL NERVES: II:  No visual field defects.    III-IV-VI: Pupils equal round and reactive to light.  Normal conjugate, extra-ocular eye movements in all directions of gaze.  No nystagmus.  No ptosis.   V:  Normal facial sensation.    VII:  Normal facial symmetry and movements.   VIII:  Normal hearing and vestibular function.   IX-X:  Normal palatal movement.   XI:  Normal shoulder shrug and head rotation.   XII:  Normal tongue strength and range of motion, no deviation or fasciculation.  MOTOR:  No atrophy, fasciculations or abnormal movements.  No pronator drift.   Upper Extremity:  Right  Left  Deltoid  5/5   5/5   Biceps  5/5   5/5   Triceps  5/5   5/5   Wrist extensors  5/5   5/5   Wrist flexors  5/5   5/5   Finger extensors  5/5   5/5   Finger flexors  5/5   5/5   Dorsal interossei  5/5   5/5   Abductor pollicis  5/5   5/5   Tone (Ashworth scale)  0  0   Lower Extremity:  Right  Left  Hip flexors  5/5   5/5   Knee flexors  5/5   5/5   Knee extensors  5/5   5/5   Dorsiflexors  5/5   5/5   Plantarflexors  5/5   5/5   Toe extensors  5/5   5/5   Toe flexors  5/5   5/5   Tone (Ashworth scale)  0  0   MSRs:  Right        Left                  brachioradialis 2+  2+  biceps 2+  2+  triceps 2+  2+  patellar 2+  2+  ankle jerk 2+  2+  Hoffman no  no  plantar response down  down   SENSORY:  Normal and symmetric perception of light touch, pinprick, vibration, and temperature.  Romberg's sign absent.   COORDINATION/GAIT: Normal finger-to- nose-finger.  Intact rapid alternating movements bilaterally.  Gait narrow based and stable. Tandem and stressed gait intact.     Thank you for allowing me to participate in patient's care.  If I can answer any additional questions, I would be pleased to do so.    Sincerely,    Elzie Sheets K. , DO

## 2021-10-30 LAB — METHYLMALONIC ACID, SERUM: Methylmalonic Acid, Quant: 98 nmol/L (ref 87–318)

## 2021-11-01 ENCOUNTER — Ambulatory Visit (INDEPENDENT_AMBULATORY_CARE_PROVIDER_SITE_OTHER): Payer: 59 | Admitting: Neurology

## 2021-11-01 ENCOUNTER — Encounter: Payer: Self-pay | Admitting: Neurology

## 2021-11-01 VITALS — BP 117/76 | HR 51 | Ht 66.0 in | Wt 184.0 lb

## 2021-11-01 DIAGNOSIS — R200 Anesthesia of skin: Secondary | ICD-10-CM | POA: Diagnosis not present

## 2021-11-01 NOTE — Patient Instructions (Signed)
If your symptoms get worse, call the office and we can order nerve testing.

## 2021-11-24 DIAGNOSIS — F4322 Adjustment disorder with anxiety: Secondary | ICD-10-CM | POA: Diagnosis not present

## 2021-12-04 ENCOUNTER — Other Ambulatory Visit (HOSPITAL_COMMUNITY): Payer: Self-pay

## 2021-12-22 ENCOUNTER — Ambulatory Visit: Payer: 59 | Admitting: Family

## 2021-12-23 ENCOUNTER — Encounter: Payer: Self-pay | Admitting: Family Medicine

## 2021-12-23 ENCOUNTER — Other Ambulatory Visit (HOSPITAL_COMMUNITY): Payer: Self-pay

## 2021-12-23 ENCOUNTER — Ambulatory Visit: Payer: 59 | Admitting: Family Medicine

## 2021-12-23 VITALS — BP 128/80 | HR 68 | Temp 97.7°F | Ht 66.0 in | Wt 188.4 lb

## 2021-12-23 DIAGNOSIS — J4520 Mild intermittent asthma, uncomplicated: Secondary | ICD-10-CM | POA: Diagnosis not present

## 2021-12-23 DIAGNOSIS — I10 Essential (primary) hypertension: Secondary | ICD-10-CM

## 2021-12-23 MED ORDER — PREDNISONE 50 MG PO TABS
50.0000 mg | ORAL_TABLET | Freq: Every day | ORAL | 0 refills | Status: DC
  Filled 2021-12-23 (×2): qty 5, 5d supply, fill #0

## 2021-12-23 NOTE — Assessment & Plan Note (Signed)
Overall stable though may have had a mild flare a couple of days ago.  No wheezing or signs of respiratory distress today on exam.  He can continue using the albuterol as needed.  We will send a small supply of prednisone in to use if symptoms worsen or do not continue to improve over the next several days.

## 2021-12-23 NOTE — Progress Notes (Signed)
   Max Weeks is a 36 y.o. male who presents today for an office visit.  Assessment/Plan:  New/Acute Problems: Cough Likely viral URI.  May had a mild asthma flare as well.  It is reassuring that symptoms seem to be improving and has a reassuring exam today.  Normal vitals.  We will send a pocket prescription for prednisone as below.  He will let us know if symptoms do not continue to improve.  Chronic Problems Addressed Today: Mild intermittent asthma without complication Overall stable though may have had a mild flare a couple of days ago.  No wheezing or signs of respiratory distress today on exam.  He can continue using the albuterol as needed.  We will send a small supply of prednisone in to use if symptoms worsen or do not continue to improve over the next several days.  Essential hypertension Blood pressure at goal on amlodipine 5 mg daily.     Subjective:  HPI:  See A/p for status of chronic conditions.  He has been having cough and congestion for the last several days. Took a home covid test which was negative yesterday.  Some hoarse voice and some chest tightness. Some nasal congestion. No fevers. Some muscle aches. No dyspnea. Tried taking albuterol without much.        Objective:  Physical Exam: BP 128/80   Pulse 68   Temp 97.7 F (36.5 C) (Temporal)   Ht 5\' 6"  (1.676 m)   Wt 188 lb 6.4 oz (85.5 kg)   SpO2 98%   BMI 30.41 kg/m   Gen: No acute distress, resting comfortably CV: Regular rate and rhythm with no murmurs appreciated Pulm: Normal work of breathing, clear to auscultation bilaterally with no crackles, wheezes, or rhonchi Neuro: Grossly normal, moves all extremities Psych: Normal affect and thought content      Carlisha Wisler M. , MD 12/23/2021 11:39 AM

## 2021-12-23 NOTE — Assessment & Plan Note (Signed)
Blood pressure at goal on amlodipine 5 mg daily.

## 2021-12-23 NOTE — Patient Instructions (Signed)
It was very nice to see you today!  Your lungs sound great today.  I will send in a small supply of prednisone to use if your symptoms worsen or do not continue to improve.  Take care, Dr Jimmey Ralph  PLEASE NOTE:  If you had any lab tests please let us know if you have not heard back within a few days. You may see your results on mychart before we have a chance to review them but we will give you a call once they are reviewed by [place]. If we ordered any referrals today, please let us know if you have not heard from their office within the next week.   Please try these tips to maintain a healthy lifestyle:  Eat at least 3 REAL meals and 1-2 snacks per day.  Aim for no more than 5 hours between eating.  If you eat breakfast, please do so within one hour of getting up.   Each meal should contain half fruits/vegetables, one quarter protein, and one quarter carbs (no bigger than a computer mouse)  Cut down on sweet beverages. This includes juice, soda, and sweet tea.   Drink at least 1 glass of water with each meal and aim for at least 8 glasses per day  Exercise at least 150 minutes every week.

## 2021-12-24 ENCOUNTER — Other Ambulatory Visit (HOSPITAL_COMMUNITY): Payer: Self-pay

## 2022-01-10 ENCOUNTER — Other Ambulatory Visit (HOSPITAL_COMMUNITY): Payer: Self-pay

## 2022-01-18 ENCOUNTER — Other Ambulatory Visit (HOSPITAL_COMMUNITY): Payer: Self-pay

## 2022-01-19 DIAGNOSIS — F4322 Adjustment disorder with anxiety: Secondary | ICD-10-CM | POA: Diagnosis not present

## 2022-01-20 ENCOUNTER — Other Ambulatory Visit (HOSPITAL_COMMUNITY): Payer: Self-pay

## 2022-02-02 DIAGNOSIS — F4322 Adjustment disorder with anxiety: Secondary | ICD-10-CM | POA: Diagnosis not present

## 2022-03-16 DIAGNOSIS — F4322 Adjustment disorder with anxiety: Secondary | ICD-10-CM | POA: Diagnosis not present

## 2022-03-31 ENCOUNTER — Ambulatory Visit (INDEPENDENT_AMBULATORY_CARE_PROVIDER_SITE_OTHER): Payer: 59 | Admitting: Family Medicine

## 2022-03-31 ENCOUNTER — Ambulatory Visit: Payer: Self-pay

## 2022-03-31 VITALS — BP 112/76 | HR 55 | Ht 66.0 in | Wt 189.0 lb

## 2022-03-31 DIAGNOSIS — M25562 Pain in left knee: Secondary | ICD-10-CM

## 2022-03-31 DIAGNOSIS — M25561 Pain in right knee: Secondary | ICD-10-CM

## 2022-03-31 NOTE — Patient Instructions (Addendum)
Thank you for coming in today.   Please use Voltaren gel (Generic Diclofenac Gel) up to 4x daily for pain as needed.  This is available over-the-counter as both the name brand Voltaren gel and the generic diclofenac gel.   Eccentric quad stretching following exercise will prevent DOMS.  Recovery meal following exercise.  Chocolate milk is traditional

## 2022-03-31 NOTE — Progress Notes (Signed)
I, Peterson Lombard, LAT, ATC acting as a scribe for Lynne Leader, MD.  Max Weeks is a 37 y.o. male who presents to [facility] today for bilat knee pain. Pt was previously seen by Dr. Georgina Snell on  05/18/21 for  bilateral toe paresthesias. Today, pt c/o bilat knee pain x 2 wks. Pt has been working out and doing some jujitsu, and then was on his feet a lot for work. Pt doesn't have a specific location of pain, but reports it was more of a "soreness." Pt notes he is not currently really having any pain. In 2018 pt had a menisectomy in his R knee.    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 He did jujitsu for 3 or 4 days last week and had excessive quad soreness that is now settled down to the knees and is improving.  This is the first time he is doing jujitsu and quite a while.  Knee swelling: no- feels a sense of fullness Mechanical symptoms: no Aggravates: increased activity. Treatments tried: ice, rest, different shoes   Pertinent review of systems: No fevers or chills  Relevant historical information: Mild intermittent asthma.  Hypertension.   Exam:  BP 112/76   Pulse (!) 55   Ht 5' 6"$  (1.676 m)   Wt 189 lb (85.7 kg)   SpO2 98%   BMI 30.51 kg/m  General: Well Developed, well nourished, and in no acute distress.   MSK: Knees bilaterally normal-appearing without significant swelling or effusion. Normal motion with minimal crepitation.  Stable ligamentous exam.  Intact strength.   Lab and Radiology Results  Diagnostic Limited MSK Ultrasound of: Right knee Quad tendon normal-appearing Patellar tendon normal-appearing Lateral meniscus normal. Medial meniscus degenerative. Posterior knee no Baker's cyst. Impression: Diminutive or degenerative medial meniscus otherwise normal-appearing right knee ultrasound.  Diagnostic Limited MSK Ultrasound of: Left knee: Quad tendon normal-appearing Patellar tendon  normal-appearing Lateral meniscus normal-appearing Medial meniscus normal-appearing Posterior knee no Baker's cyst. Impression: Normal MSK ultrasound left knee.    Assessment and Plan: 37 y.o. male with bilateral knee pain.  Pain initially was more in the quad muscle bilaterally following an intense series of exercises that were not normal for him.  That pain was more consistent with delayed onset muscle soreness.  His pain then transition more to anterior knee pain that is most consistent with patellofemoral pain syndrome.  Regardless it seems to be improving quite a bit now.  Plan for a bit of watchful waiting.  Reviewed some treatment strategies to reduce or prevent delayed onset muscle soreness in the future including eccentric stretching following exercise and a recovery meal such as chocolate milk following exercise. If this is becoming a recurrent problem next step should be physical therapy.  He will let me know and I can order it.  He will keep me updated with how things go.  We talked about knee x-rays.  We both decided to hold off on x-rays for now.  PDMP not reviewed this encounter. Orders Placed This Encounter  Procedures   [place] LIMITED JOINT SPACE STRUCTURES LOW BILAT(NO LINKED CHARGES)    Order Specific Question:   Reason for Exam (SYMPTOM  OR DIAGNOSIS REQUIRED)    Answer:   bilateral knee pain    Order Specific Question:   Preferred imaging location?    Answer:   [facility]   No orders of the defined  types were placed in this encounter.    Discussed warning signs or symptoms. Please see discharge instructions. Patient expresses understanding.   The above documentation has been reviewed and is accurate and complete Lynne Leader, M.D.

## 2022-04-13 DIAGNOSIS — F4322 Adjustment disorder with anxiety: Secondary | ICD-10-CM | POA: Diagnosis not present

## 2022-04-26 LAB — HEMOGLOBIN A1C: Hemoglobin A1C: 5.7

## 2022-04-26 LAB — HEPATIC FUNCTION PANEL
ALT: 19 U/L (ref 10–40)
AST: 25 (ref 14–40)
Alkaline Phosphatase: 89 (ref 25–125)
Bilirubin, Direct: 0.2

## 2022-04-26 LAB — COMPREHENSIVE METABOLIC PANEL
Albumin: 4.4 (ref 3.5–5.0)
Calcium: 9.6 (ref 8.7–10.7)
Globulin: 3
eGFR: 102

## 2022-04-26 LAB — TSH: TSH: 3.24 (ref 0.41–5.90)

## 2022-04-26 LAB — LIPID PANEL
Cholesterol: 171 (ref 0–200)
HDL: 49 (ref 35–70)
LDL Cholesterol: 111
Triglycerides: 55 (ref 40–160)

## 2022-04-26 LAB — BASIC METABOLIC PANEL
BUN: 17 (ref 4–21)
CO2: 22 (ref 13–22)
Chloride: 105 (ref 99–108)
Creatinine: 1 (ref 0.6–1.3)
Glucose: 83
Potassium: 4.7 mEq/L (ref 3.5–5.1)
Sodium: 141 (ref 137–147)

## 2022-04-26 LAB — CBC AND DIFFERENTIAL
HCT: 45 (ref 41–53)
Hemoglobin: 14.6 (ref 13.5–17.5)
Neutrophils Absolute: 34
Platelets: 275 10*3/uL (ref 150–400)
WBC: 3.6

## 2022-04-26 LAB — PSA: PSA: 0.6

## 2022-04-26 LAB — CBC: RBC: 5.17 — AB (ref 3.87–5.11)

## 2022-04-27 DIAGNOSIS — F4322 Adjustment disorder with anxiety: Secondary | ICD-10-CM | POA: Diagnosis not present

## 2022-04-29 ENCOUNTER — Encounter: Payer: Self-pay | Admitting: Family Medicine

## 2022-05-02 ENCOUNTER — Encounter: Payer: Self-pay | Admitting: Family Medicine

## 2022-06-06 ENCOUNTER — Encounter: Payer: Self-pay | Admitting: Family Medicine

## 2022-06-06 ENCOUNTER — Other Ambulatory Visit: Payer: Self-pay

## 2022-06-06 ENCOUNTER — Ambulatory Visit (INDEPENDENT_AMBULATORY_CARE_PROVIDER_SITE_OTHER): Payer: 59 | Admitting: Family Medicine

## 2022-06-06 ENCOUNTER — Other Ambulatory Visit (HOSPITAL_COMMUNITY): Payer: Self-pay

## 2022-06-06 VITALS — BP 128/84 | HR 57 | Ht 66.0 in | Wt 182.2 lb

## 2022-06-06 DIAGNOSIS — M25512 Pain in left shoulder: Secondary | ICD-10-CM | POA: Diagnosis not present

## 2022-06-06 MED ORDER — NITROGLYCERIN 0.2 MG/HR TD PT24
MEDICATED_PATCH | TRANSDERMAL | 1 refills | Status: DC
  Filled 2022-06-06: qty 22, 88d supply, fill #0

## 2022-06-06 NOTE — Patient Instructions (Signed)
Thank you for coming in today.   Lets see how this goes.   Keep working on the home exercises.   I can order PT any time.   Ok to start the nitro patches when you are ready.   We could do injection any time.   We could do MRI or MRI arthogram any time.   Nitroglycerin Protocol  Apply 1/4 nitroglycerin patch to affected area daily. Change position of patch within the affected area every 24 hours. You may experience a headache during the first 1-2 weeks of using the patch, these should subside. If you experience headaches after beginning nitroglycerin patch treatment, you may take your preferred over the counter pain reliever. Another side effect of the nitroglycerin patch is skin irritation or rash related to patch adhesive. Please notify our office if you develop more severe headaches or rash, and stop the patch. Tendon healing with nitroglycerin patch may require 12 to 24 weeks depending on the extent of injury. Men should not use if taking Viagra, Cialis, or Levitra.  Do not use if you have migraines or rosacea.

## 2022-06-06 NOTE — Progress Notes (Unsigned)
   I, Stevenson Clinch, CMA acting as a scribe for Clementeen Graham, MD.  Max Weeks is a 37 y.o. male who presents to [facility] today for L shoulder pain. 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 Pt was previously seen by Dr. Denyse Amass on 03/31/22 for bilat knee pain.  Today, pt reports L shoulder pain x 1 week. He was last seen for his L shoulder on 06/25/20 and completed a course of PT at [facility]. He locates pain to entire joint. Mostly deep joint pain, occasional sharp pain. Pain radiating into the triceps. Sx have improved since onset. Sx worse when typing. Did well with Nitro Protocol. Minimal relief with topical Voltaren, Tier Balm provided more relief. Slight decreased ROM, improving. Denies neck pain, some tightness in the traps. Denies n/t, weakness, decreased grip strength.   Dx imaging: 06/25/20 L shoulder XR  Pertinent review of systems: No fevers or chills  Relevant historical information: Hypertension   Exam:  BP 128/84   Pulse (!) 57   Ht  (1.676 m)   Wt 182 lb 3.2 oz (82.6 kg)   SpO2 98%   BMI 29.41 kg/m  General: Well Developed, well nourished, and in no acute distress.   MSK: Left shoulder: Normal-appearing Nontender to palpation. Some popping and clicking present with abduction. Normal range of motion. Intact strength. Positive Neer's test negative Hawking's test. Equivocal empty can test. Negative Yergason's and speeds test. Negative clunk and relocation test. Mildly positive O'Brien's test.    Lab and Radiology Results  Diagnostic Limited MSK Ultrasound of: Left shoulder Biceps tendon is intact Subscapularis tendon is intact.   Supraspinatus tendon is thin without definitive tear visible. Infraspinatus tendon is intact. A/C joint normal. Impression: Thin supraspinatus tendon but no definitive tear is visible.      Assessment and Plan: 37 y.o. male with left shoulder..  Patient  is having an exacerbation of chronic shoulder pain.  Pain could be due to rotator cuff tendinitis and impingement or possible tear.  There is a possibility has a labrum tear as well.  This is a bit of a flareup.  Plan for nitroglycerin patch protocol and home exercise program.  Consider formal physical therapy referral.  Recheck based on progress.   PDMP not reviewed this encounter. Orders Placed This Encounter  Procedures   [place] LIMITED JOINT SPACE STRUCTURES UP LEFT(NO LINKED CHARGES)    Order Specific Question:   Reason for Exam (SYMPTOM  OR DIAGNOSIS REQUIRED)    Answer:   left shoulder pain    Order Specific Question:   Preferred imaging location?    Answer:   Adult [facility] ordered this encounter  Medications   nitroGLYCERIN (NITRODUR - DOSED IN MG/24 HR) 0.2 mg/hr patch    Sig: Apply 1/4 patch daily to tendon for tendonitis.    Dispense:  30 patch    Refill:  1    Pickup in person. Do not mail.     Discussed warning signs or symptoms. Please see discharge instructions. Patient expresses understanding.   The above documentation has been reviewed and is accurate and complete Clementeen Graham, M.D.

## 2022-06-07 ENCOUNTER — Other Ambulatory Visit (HOSPITAL_COMMUNITY): Payer: Self-pay

## 2022-06-08 DIAGNOSIS — F4322 Adjustment disorder with anxiety: Secondary | ICD-10-CM | POA: Diagnosis not present

## 2022-06-15 ENCOUNTER — Other Ambulatory Visit (HOSPITAL_COMMUNITY): Payer: Self-pay

## 2022-07-20 DIAGNOSIS — F4389 Other reactions to severe stress: Secondary | ICD-10-CM | POA: Diagnosis not present

## 2022-08-17 DIAGNOSIS — F418 Other specified anxiety disorders: Secondary | ICD-10-CM | POA: Diagnosis not present

## 2022-08-30 ENCOUNTER — Encounter: Payer: Self-pay | Admitting: Family Medicine

## 2022-08-30 ENCOUNTER — Ambulatory Visit: Payer: 59 | Admitting: Family Medicine

## 2022-08-30 VITALS — BP 112/74 | HR 57 | Temp 97.8°F | Ht 66.0 in | Wt 171.6 lb

## 2022-08-30 DIAGNOSIS — H6120 Impacted cerumen, unspecified ear: Secondary | ICD-10-CM

## 2022-08-30 NOTE — Progress Notes (Signed)
   Max Weeks is a 37 y.o. male who presents today for an office visit.  Assessment/Plan:  Cerumen Impaction  Successfully irrigated by RMA today.  He can use over-the-counter hydrogen peroxide or Debrox to prevent buildup.  Discussed reasons to return to care.     Subjective:  HPI:  Patient here with fullness in right ear. He is concerned about cerumen impaction. Started a few days ago. Tried using debrox but this did not work well.         Objective:  Physical Exam: BP 112/74   Pulse (!) 57   Temp 97.8 F (36.6 C) (Temporal)   Ht 5\' 6"  (1.676 m)   Wt 171 lb 9.6 oz (77.8 kg)   SpO2 98%   BMI 27.70 kg/m   Gen: No acute distress, resting comfortably HEENT: Impaction noted in bilateral EAC.  After irrigation bilateral EACs with small amount of irritation.  TMs clear. Neuro: Grossly normal, moves all extremities Psych: Normal affect and thought content      Tavish Gettis M. Jimmey Ralph, MD 08/30/2022 2:18 PM

## 2022-08-31 ENCOUNTER — Ambulatory Visit: Payer: 59 | Admitting: Physician Assistant

## 2022-09-14 DIAGNOSIS — F418 Other specified anxiety disorders: Secondary | ICD-10-CM | POA: Diagnosis not present

## 2022-09-28 DIAGNOSIS — F418 Other specified anxiety disorders: Secondary | ICD-10-CM | POA: Diagnosis not present

## 2022-09-29 ENCOUNTER — Encounter (INDEPENDENT_AMBULATORY_CARE_PROVIDER_SITE_OTHER): Payer: Self-pay

## 2022-11-01 ENCOUNTER — Ambulatory Visit (INDEPENDENT_AMBULATORY_CARE_PROVIDER_SITE_OTHER): Payer: 59 | Admitting: Family Medicine

## 2022-11-01 ENCOUNTER — Encounter: Payer: Self-pay | Admitting: Family Medicine

## 2022-11-01 ENCOUNTER — Other Ambulatory Visit (HOSPITAL_COMMUNITY)
Admission: RE | Admit: 2022-11-01 | Discharge: 2022-11-01 | Disposition: A | Discharge status: Home / Self Care | Payer: 59 | Source: Ambulatory Visit | Attending: Family Medicine | Admitting: Family Medicine

## 2022-11-01 ENCOUNTER — Other Ambulatory Visit (HOSPITAL_COMMUNITY): Payer: Self-pay

## 2022-11-01 VITALS — BP 116/73 | HR 51 | Temp 97.0°F | Ht 66.0 in | Wt 174.6 lb

## 2022-11-01 DIAGNOSIS — Z0000 Encounter for general adult medical examination without abnormal findings: Secondary | ICD-10-CM

## 2022-11-01 DIAGNOSIS — Z113 Encounter for screening for infections with a predominantly sexual mode of transmission: Secondary | ICD-10-CM | POA: Diagnosis not present

## 2022-11-01 DIAGNOSIS — I10 Essential (primary) hypertension: Secondary | ICD-10-CM | POA: Diagnosis not present

## 2022-11-01 DIAGNOSIS — J4520 Mild intermittent asthma, uncomplicated: Secondary | ICD-10-CM | POA: Diagnosis not present

## 2022-11-01 LAB — COMPREHENSIVE METABOLIC PANEL
ALT: 25 U/L (ref 0–53)
AST: 27 U/L (ref 0–37)
Albumin: 3.7 g/dL (ref 3.5–5.2)
Alkaline Phosphatase: 91 U/L (ref 39–117)
BUN: 17 mg/dL (ref 6–23)
CO2: 24 meq/L (ref 19–32)
Calcium: 8.8 mg/dL (ref 8.4–10.5)
Chloride: 108 meq/L (ref 96–112)
Creatinine, Ser: 0.98 mg/dL (ref 0.40–1.50)
GFR: 99.01 mL/min (ref >60.00)
Glucose, Bld: 90 mg/dL (ref 70–99)
Potassium: 4.1 meq/L (ref 3.5–5.1)
Sodium: 138 meq/L (ref 135–145)
Total Bilirubin: 0.4 mg/dL (ref 0.2–1.2)
Total Protein: 6.8 g/dL (ref 6.0–8.3)

## 2022-11-01 LAB — CBC
HCT: 42.2 % (ref 39.0–52.0)
Hemoglobin: 13.5 g/dL (ref 13.0–17.0)
MCHC: 32 g/dL (ref 30.0–36.0)
MCV: 88.7 fl (ref 78.0–100.0)
Platelets: 261 10*3/uL (ref 150.0–400.0)
RBC: 4.75 Mil/uL (ref 4.22–5.81)
RDW: 14 % (ref 11.5–15.5)
WBC: 2.9 10*3/uL — ABNORMAL LOW (ref 4.0–10.5)

## 2022-11-01 LAB — LIPID PANEL
Cholesterol: 158 mg/dL (ref 0–200)
HDL: 46.9 mg/dL (ref >39.00)
LDL Cholesterol: 99 mg/dL (ref 0–99)
NonHDL: 111.06
Total CHOL/HDL Ratio: 3
Triglycerides: 59 mg/dL (ref 0.0–149.0)
VLDL: 11.8 mg/dL (ref 0.0–40.0)

## 2022-11-01 LAB — HEMOGLOBIN A1C: Hgb A1c MFr Bld: 5.5 % (ref 4.6–6.5)

## 2022-11-01 LAB — TSH: TSH: 4.81 u[IU]/mL (ref 0.35–5.50)

## 2022-11-01 MED ORDER — ALBUTEROL SULFATE HFA 108 (90 BASE) MCG/ACT IN AERS
2.0000 | INHALATION_SPRAY | RESPIRATORY_TRACT | 3 refills | Status: DC | PRN
  Filled 2022-11-01: qty 6.7, 16d supply, fill #0

## 2022-11-01 NOTE — Patient Instructions (Signed)
It was very nice to see you today!  We will check blood work today.  I will refill your albuterol.   Keep an eye on your blood pressure and let me know if it is persistently 120/90 or higher.  We will see you back in a year for next physical.  Come in sooner if needed.  Return in about 1 year (around 11/01/2023) for Annual Physical.   Take care, Dr Jimmey Ralph  PLEASE NOTE:  If you had any lab tests, please let us know if you have not heard back within a few days. You may see your results on mychart before we have a chance to review them but we will give you a call once they are reviewed by [place].   If we ordered any referrals today, please let us know if you have not heard from their office within the next week.   If you had any urgent prescriptions sent in today, please check with the pharmacy within an hour of our visit to make sure the prescription was transmitted appropriately.   Please try these tips to maintain a healthy lifestyle:  Eat at least 3 REAL meals and 1-2 snacks per day.  Aim for no more than 5 hours between eating.  If you eat breakfast, please do so within one hour of getting up.   Each meal should contain half fruits/vegetables, one quarter protein, and one quarter carbs (no bigger than a computer mouse)  Cut down on sweet beverages. This includes juice, soda, and sweet tea.   Drink at least 1 glass of water with each meal and aim for at least 8 glasses per day  Exercise at least 150 minutes every week.    Preventive Care 55-69 Years Old, Male Preventive care refers to lifestyle choices and visits with your health care provider that can promote health and wellness. Preventive care visits are also called wellness exams. What can I expect for my preventive care visit? Counseling During your preventive care visit, your health care provider may ask about your: Medical history, including: Past medical problems. Family medical history. Current health,  including: Emotional well-being. Home life and relationship well-being. Sexual activity. Lifestyle, including: Alcohol, nicotine or tobacco, and drug use. Access to firearms. Diet, exercise, and sleep habits. Safety issues such as seatbelt and bike helmet use. Sunscreen use. Work and work Astronomer. Physical exam Your health care provider may check your: Height and weight. These may be used to calculate your BMI (body mass index). BMI is a measurement that tells if you are at a healthy weight. Waist circumference. This measures the distance around your waistline. This measurement also tells if you are at a healthy weight and may help predict your risk of certain diseases, such as type 2 diabetes and high blood pressure. Heart rate and blood pressure. Body temperature. Skin for abnormal spots. What immunizations do I need?  Vaccines are usually given at various ages, according to a schedule. Your health care provider will recommend vaccines for you based on your age, medical history, and lifestyle or other factors, such as travel or where you work. What tests do I need? Screening Your health care provider may recommend screening tests for certain conditions. This may include: Lipid and cholesterol levels. Diabetes screening. This is done by checking your blood sugar (glucose) after you have not eaten for a while (fasting). Hepatitis B test. Hepatitis C test. HIV (human immunodeficiency virus) test. STI (sexually transmitted infection) testing, if you are at risk. Talk  with your health care provider about your test results, treatment options, and if necessary, the need for more tests. Follow these instructions at home: Eating and drinking  Eat a healthy diet that includes fresh fruits and vegetables, whole grains, lean protein, and low-fat dairy products. Drink enough fluid to keep your urine pale yellow. Take vitamin and mineral supplements as recommended by your health care  provider. Do not drink alcohol if your health care provider tells you not to drink. If you drink alcohol: Limit how much you have to 0-2 drinks a day. Know how much alcohol is in your drink. In the [place] one drink equals one 12 oz bottle of beer (355 mL), one 5 oz glass of wine (148 mL), or one 1 oz glass of hard liquor (44 mL). Lifestyle Brush your teeth every morning and night with fluoride toothpaste. Floss one time each day. Exercise for at least 30 minutes 5 or more days each week. Do not use any products that contain nicotine or tobacco. These products include cigarettes, chewing tobacco, and vaping devices, such as e-cigarettes. If you need help quitting, ask your health care provider. Do not use drugs. If you are sexually active, practice safe sex. Use a condom or other form of protection to prevent STIs. Find healthy ways to manage stress, such as: Meditation, yoga, or listening to music. Journaling. Talking to a trusted person. Spending time with friends and family. Minimize exposure to UV radiation to reduce your risk of skin cancer. Safety Always wear your seat belt while driving or riding in a vehicle. Do not drive: If you have been drinking alcohol. Do not ride with someone who has been drinking. If you have been using any mind-altering substances or drugs. While texting. When you are tired or distracted. Wear a helmet and other protective equipment during sports activities. If you have firearms in your house, make sure you follow all gun safety procedures. Seek help if you have been physically or sexually abused. What's next? Go to your health care provider once a year for an annual wellness visit. Ask your health care provider how often you should have your eyes and teeth checked. Stay up to date on all vaccines. This information is not intended to replace advice given to you by your health care provider. Make sure you discuss any questions you have with your health  care provider. Document Revised: 07/29/2020 Document Reviewed: 07/29/2020 Elsevier Patient Education  2024 [employer].

## 2022-11-01 NOTE — Assessment & Plan Note (Signed)
Recently stopped amlodipine.  Blood pressure readings have been at goal.  At goal today.  He will hold off on amlodipine continue to monitor at home.  He will let us know if persistently elevated.

## 2022-11-01 NOTE — Addendum Note (Signed)
Addended by: Dyann Kief on: 11/01/2022 08:12 AM   Modules accepted: Orders

## 2022-11-01 NOTE — Progress Notes (Signed)
Chief Complaint:  Max Weeks is a 37 y.o. male who presents today for his annual comprehensive physical exam.    Assessment/Plan:  Chronic Problems Addressed Today: Essential hypertension Recently stopped amlodipine.  Blood pressure readings have been at goal.  At goal today.  He will hold off on amlodipine continue to monitor at home.  He will let us know if persistently elevated.  Mild intermittent asthma without complication Stable on albuterol as needed.  Will refill today.  Preventative Healthcare: Check labs.  He will check about tetanus vaccine status though may be due next year.  Up-to-date on other vaccines.  Patient Counseling(The following topics were reviewed and/or handout was given):  -Nutrition: Stressed importance of moderation in sodium/caffeine intake, saturated fat and cholesterol, caloric balance, sufficient intake of fresh fruits, vegetables, and fiber.  -Stressed the importance of regular exercise.   -Substance Abuse: Discussed cessation/primary prevention of tobacco, alcohol, or other drug use; driving or other dangerous activities under the influence; availability of treatment for abuse.   -Injury prevention: Discussed safety belts, safety helmets, smoke detector, smoking near bedding or upholstery.   -Sexuality: Discussed sexually transmitted diseases, partner selection, use of condoms, avoidance of unintended pregnancy and contraceptive alternatives.   -Dental health: Discussed importance of regular tooth brushing, flossing, and dental visits.  -Health maintenance and immunizations reviewed. Please refer to Health maintenance section.  Return to care in 1 year for next preventative visit.     Subjective:  HPI:  He has no acute complaints today. See Assessment / plan for status of chronic conditions.   Lifestyle Diet: Working with myfitness pal.  Exercise: Trying to stay more active. Cardio and weight training.      11/01/2022    7:42 AM   Depression screen PHQ 2/9  Decreased Interest 0  Down, Depressed, Hopeless 0  PHQ - 2 Score 0    Health Maintenance Due  Topic Date Due   DTaP/Tdap/Td (2 - Tdap) 01/18/2005     ROS: Per HPI, otherwise a complete review of systems was negative.   PMH:  The following were reviewed and entered/updated in epic: Past Medical History:  Diagnosis Date   Asthma    Hypertension    Patient Active Problem List   Diagnosis Date Noted   Paresthesia 10/27/2021   Migraine 03/10/2020   Essential hypertension 08/09/2017   Mild intermittent asthma without complication 08/09/2017   Past Surgical History:  Procedure Laterality Date   KNEE ARTHROSCOPY     meniscal tear - ju jitsu injury    Family History  Problem Relation Age of Onset   Hypertension Mother    Lupus Mother    Other Father        Pre-Diabetes   Diabetes Maternal Grandmother    Hypertension Maternal Grandmother    Stroke Maternal Grandmother    COPD Maternal Grandfather     Medications- reviewed and updated Current Outpatient Medications  Medication Sig Dispense Refill   albuterol (VENTOLIN HFA) 108 (90 Base) MCG/ACT inhaler Inhale 2 puffs into the lungs every 4 (four) hours as needed for wheezing or shortness of breath. 6.7 g 3   No current facility-administered medications for this visit.    Allergies-reviewed and updated No Known Allergies  Social History   Socioeconomic History   Marital status: Single    Spouse name: Not on file   Number of children: 0   Years of education: Not on file   Highest education level: Not on file  Occupational History  Not on file  Tobacco Use   Smoking status: Never   Smokeless tobacco: Never  Substance and Sexual Activity   Alcohol use: Yes    Comment: Occasional   Drug use: Never   Sexual activity: Not on file  Other Topics Concern   Not on file  Social History Narrative   Hospitalist at [employer]      Right Handed    Lives in one story home    Social  Determinants of Health   Financial Resource Strain: Not on file  Food Insecurity: Not on file  Transportation Needs: Not on file  Physical Activity: Not on file  Stress: Not on file  Social Connections: Not on file        Objective:  Physical Exam: BP 116/73   Pulse (!) 51   Temp (!) 97 F (36.1 C) (Temporal)   Ht 5\' 6"  (1.676 m)   Wt 174 lb 9.6 oz (79.2 kg)   SpO2 98%   BMI 28.18 kg/m   Body mass index is 28.18 kg/m. Wt Readings from Last 3 Encounters:  11/01/22 174 lb 9.6 oz (79.2 kg)  08/30/22 171 lb 9.6 oz (77.8 kg)  06/06/22 182 lb 3.2 oz (82.6 kg)  Gen: NAD, resting comfortably HEENT: TMs normal bilaterally. OP clear. No thyromegaly noted.  CV: RRR with no murmurs appreciated Pulm: NWOB, CTAB with no crackles, wheezes, or rhonchi GI: Normal bowel sounds present. Soft, Nontender, Nondistended. MSK: no edema, cyanosis, or clubbing noted Skin: warm, dry Neuro: CN2-12 grossly intact. Strength 5/5 in upper and lower extremities. Reflexes symmetric and intact bilaterally.  Psych: Normal affect and thought content     Codi Kertz M. Jimmey Ralph, MD 11/01/2022 8:08 AM

## 2022-11-01 NOTE — Assessment & Plan Note (Signed)
Stable on albuterol as needed.  Will refill today.

## 2022-11-03 NOTE — Progress Notes (Signed)
Labs are all at goal.  We can recheck in a year.

## 2022-11-11 ENCOUNTER — Other Ambulatory Visit (HOSPITAL_COMMUNITY): Payer: Self-pay

## 2023-05-24 LAB — LIPID PANEL
Cholesterol: 212 — AB (ref 0–200)
HDL: 53 (ref 35–70)
LDL Cholesterol: 149
Triglycerides: 54 (ref 40–160)

## 2023-05-24 LAB — BASIC METABOLIC PANEL WITH GFR
BUN: 19 (ref 4–21)
CO2: 23 — AB (ref 13–22)
Chloride: 101 (ref 99–108)
Creatinine: 1.1 (ref 0.6–1.3)
Glucose: 86
Potassium: 4.3 meq/L (ref 3.5–5.1)
Sodium: 139 (ref 137–147)

## 2023-05-24 LAB — CBC AND DIFFERENTIAL
HCT: 45 (ref 41–53)
Hemoglobin: 14.2 (ref 13.5–17.5)
Neutrophils Absolute: 1.3
Platelets: 266 10*3/uL (ref 150–400)
WBC: 3.6

## 2023-05-24 LAB — PSA: PSA: 0.7

## 2023-05-24 LAB — HEMOGLOBIN A1C: Hemoglobin A1C: 5.6

## 2023-05-24 LAB — HEPATIC FUNCTION PANEL
ALT: 14 U/L (ref 10–40)
AST: 21 (ref 14–40)
Alkaline Phosphatase: 78 (ref 25–125)
Bilirubin, Total: 0.3

## 2023-05-24 LAB — COMPREHENSIVE METABOLIC PANEL WITH GFR
Albumin: 4.5 (ref 3.5–5.0)
Calcium: 9.4 (ref 8.7–10.7)
Globulin: 2.5
eGFR: 92

## 2023-05-24 LAB — CBC: RBC: 4.95 (ref 3.87–5.11)

## 2023-05-24 LAB — TSH: TSH: 4.07 (ref 0.41–5.90)

## 2023-05-29 ENCOUNTER — Encounter: Payer: Self-pay | Admitting: Family Medicine

## 2023-05-29 NOTE — Telephone Encounter (Signed)
 Lab abstracted on 05/29/2023

## 2023-06-07 ENCOUNTER — Encounter: Payer: Self-pay | Admitting: Family Medicine

## 2023-06-07 ENCOUNTER — Ambulatory Visit: Admitting: Family Medicine

## 2023-06-07 VITALS — BP 119/75 | HR 51 | Temp 97.5°F | Ht 66.0 in | Wt 173.4 lb

## 2023-06-07 DIAGNOSIS — Z23 Encounter for immunization: Secondary | ICD-10-CM

## 2023-06-07 DIAGNOSIS — I10 Essential (primary) hypertension: Secondary | ICD-10-CM

## 2023-06-07 DIAGNOSIS — J4520 Mild intermittent asthma, uncomplicated: Secondary | ICD-10-CM

## 2023-06-07 DIAGNOSIS — E785 Hyperlipidemia, unspecified: Secondary | ICD-10-CM | POA: Insufficient documentation

## 2023-06-07 NOTE — Assessment & Plan Note (Signed)
 Stable.  Does not need refill on albuterol .  No recent flares.

## 2023-06-07 NOTE — Patient Instructions (Signed)
 It was very nice to see you today!  Please continue to work on diet and exercise.  You can try starting a fiber supplement.  Also aim for getting fish in your diet a couple of times per week.  Please make sure that you are continuing to stay active as well.  Will give your Tdap today.  Will see you back this fall for your physical.  Come back sooner if needed.  Return in about 6 months (around 12/07/2023) for Annual Physical.   Take care, Dr Daneil Dunker  PLEASE NOTE:  If you had any lab tests, please let us  know if you have not heard back within a few days. You may see your results on mychart before we have a chance to review them but we will give you a call once they are reviewed by us .   If we ordered any referrals today, please let us  know if you have not heard from their office within the next week.   If you had any urgent prescriptions sent in today, please check with the pharmacy within an hour of our visit to make sure the prescription was transmitted appropriately.   Please try these tips to maintain a healthy lifestyle:  Eat at least 3 REAL meals and 1-2 snacks per day.  Aim for no more than 5 hours between eating.  If you eat breakfast, please do so within one hour of getting up.   Each meal should contain half fruits/vegetables, one quarter protein, and one quarter carbs (no bigger than a computer mouse)  Cut down on sweet beverages. This includes juice, soda, and sweet tea.   Drink at least 1 glass of water with each meal and aim for at least 8 glasses per day  Exercise at least 150 minutes every week.

## 2023-06-07 NOTE — Assessment & Plan Note (Signed)
 Blood pressure at goal today.  He has had a couple of elevated readings at home in the 140s to 150s over last few months though typically averaging in the 120s over 70s to 80s.  We discussed lifestyle modifications including regular exercise and low-sodium diet.  He will continue to monitor at home and let us  know if persistently elevated at home.

## 2023-06-07 NOTE — Assessment & Plan Note (Signed)
 Had lengthy discussion regarding her recent labs with total cholesterol 212, LDL 149, and HDL 53.  Per current guidelines he does not need to start pharmacologic therapy at this point.  We did discuss lifestyle modifications including increased fiber intake, fish at least 2 times weekly, and increase activity levels.  Will recheck lipids at his CPE later this year.  Can also check lipoprotein a at this point as well.  May consider cardiac calcium score at some point in future for further restratification.

## 2023-06-07 NOTE — Progress Notes (Signed)
   Max Weeks is a 38 y.o. male who presents today for an office visit.  Assessment/Plan:  Chronic Problems Addressed Today: Dyslipidemia Had lengthy discussion regarding her recent labs with total cholesterol 212, LDL 149, and HDL 53.  Per current guidelines he does not need to start pharmacologic therapy at this point.  We did discuss lifestyle modifications including increased fiber intake, fish at least 2 times weekly, and increase activity levels.  Will recheck lipids at his CPE later this year.  Can also check lipoprotein a at this point as well.  May consider cardiac calcium score at some point in future for further restratification.  Essential hypertension Blood pressure at goal today.  He has had a couple of elevated readings at home in the 140s to 150s over last few months though typically averaging in the 120s over 70s to 80s.  We discussed lifestyle modifications including regular exercise and low-sodium diet.  He will continue to monitor at home and let us  know if persistently elevated at home.  Mild intermittent asthma without complication Stable.  Does not need refill on albuterol .  No recent flares.  Preventative health care Tdap given today.   Subjective:  HPI:  See Assessment / plan for status of chronic conditions. Patient here today for follow up.  Recently had labs for doctors day. Notable for LDL 149 total cholesterol 161, HDL 53. He is working on diet and exercise. He has noticed a few elevated blood pressure readings at home the last few months but is typically at goal at home.        Objective:  Physical Exam: BP 119/75   Pulse (!) 51   Temp (!) 97.5 F (36.4 C) (Temporal)   Ht 5\' 6"  (1.676 m)   Wt 173 lb 6.4 oz (78.7 kg)   SpO2 98%   BMI 27.99 kg/m   Gen: No acute distress, resting comfortably Neuro: Grossly normal, moves all extremities Psych: Normal affect and thought content      Max Weeks M. Daneil Dunker, MD 06/07/2023 7:51 AM

## 2023-06-26 ENCOUNTER — Ambulatory Visit: Admitting: Family Medicine

## 2023-06-26 NOTE — Progress Notes (Deleted)
   Joanna Muck, PhD, LAT, ATC acting as a scribe for Garlan Juniper, MD.  ARIZONA HASHIMOTO is a 38 y.o. male who presents to [facility] at [facility] today for R leg pain. Pt was previously seen by Dr. Alease Hunter on 06/06/22 for L shoulder pain.  Today, pt c/o R leg pain x ***. Pt locates pain to   Radiating pain: LE numbness/tingling: LE weakness: Aggravates: Treatments tried:  Pertinent review of systems: ***  Relevant historical information: ***   Exam:  There were no vitals taken for this visit. General: Well Developed, well nourished, and in no acute distress.   MSK: ***    Lab and Radiology Results No results found for this or any previous visit (from the past 72 hours). No results found.     Assessment and Plan: 38 y.o. male with ***   PDMP not reviewed this encounter. No orders of the defined types were placed in this encounter.  No orders of the defined types were placed in this encounter.    Discussed warning signs or symptoms. Please see discharge instructions. Patient expresses understanding.   ***

## 2023-09-26 ENCOUNTER — Encounter: Payer: Self-pay | Admitting: Family Medicine

## 2023-09-26 ENCOUNTER — Other Ambulatory Visit (HOSPITAL_COMMUNITY): Payer: Self-pay

## 2023-09-26 ENCOUNTER — Ambulatory Visit (INDEPENDENT_AMBULATORY_CARE_PROVIDER_SITE_OTHER): Admitting: Family Medicine

## 2023-09-26 VITALS — BP 126/82 | Ht 66.0 in | Wt 160.0 lb

## 2023-09-26 DIAGNOSIS — G5620 Lesion of ulnar nerve, unspecified upper limb: Secondary | ICD-10-CM

## 2023-09-26 DIAGNOSIS — M5412 Radiculopathy, cervical region: Secondary | ICD-10-CM

## 2023-09-26 MED ORDER — MELOXICAM 15 MG PO TABS
15.0000 mg | ORAL_TABLET | Freq: Every day | ORAL | 1 refills | Status: DC
  Filled 2023-09-26: qty 30, 30d supply, fill #0

## 2023-09-26 MED ORDER — CYCLOBENZAPRINE HCL 5 MG PO TABS
2.5000 mg | ORAL_TABLET | Freq: Every day | ORAL | 1 refills | Status: DC
  Filled 2023-09-26: qty 14, 28d supply, fill #0

## 2023-09-26 NOTE — Progress Notes (Signed)
 DATE OF VISIT: 09/26/2023        Max Weeks DOB: 10-29-1985 MRN: [id number]  CC: Pulling sensation in left tricep  History of present Illness: Max Weeks is a 38 y.o. male who presents for evaluation of a pulling sensation in the left tricep that has been ongoing for the past 5 days.  He is RHD  Patient reports he frequently does jujitsu and following a jujitsu session on 09/18/2023 began experiencing neck and upper back tightness.  He denies any specific injury or mechanical symptoms.  He notes that on 09/20/2023 he began experiencing a pulling sensation to the left triceps.  He notes that a pulling sensation is exacerbated by coughing, laughing, and certain neck movements.  He denies any numbness, tingling, or weakness associated with the left triceps pain, but does report paresthesias to the 4th and 5th digit presents when sleeping with his elbows flexed.  He notes for the paresthesias he has trialed elbow extension braces which have provided significant relief of his symptoms.  He notes he has trialed Flexeril  for his triceps discomfort and reports moderate relief.  He notes he has additionally been performing stretches which have provided minimal but he denies use of NSAIDs, Tylenol, or any home exercises.Max Weeks  He reports similar symptoms in the past, but notes that his current symptoms were more persistent which prompted his visit today.  Patient notes in the past he has been evaluated for left shoulder discomfort where he underwent ultrasound imaging which demonstrated no large rotator cuff injuries.  He notes for his prior left shoulder pain he completed a course of formal physical therapy with some relief of his symptoms.  He additionally reports a history of scoliosis.  Job: Works as Licensed conveyancer at [employer]  Medications:  Outpatient Encounter Medications as of 09/26/2023  Medication Sig   cyclobenzaprine  (FLEXERIL ) 5 MG tablet Take 1/2 tab at bedtime.   meloxicam  (MOBIC ) 15 MG tablet  Take 1 tablet (15 mg total) by mouth daily.   albuterol  (VENTOLIN  HFA) 108 (90 Base) MCG/ACT inhaler Inhale 2 puffs into the lungs every 4 (four) hours as needed for wheezing or shortness of breath.   No facility-administered encounter medications on file as of 09/26/2023.    Allergies: has no known allergies.  Physical Examination: Vitals: BP 126/82   Ht 5' 6 (1.676 m)   Wt 160 lb (72.6 kg)   BMI 25.82 kg/m  GENERAL:  Max Weeks is a 38 y.o. male appearing their stated age, alert and oriented x 3, in no apparent distress.  SKIN: no rashes or lesions, skin clean, dry, intact MSK: Cervical spine: Intact active range of motion with bilateral rotation, lateral flexion, flexion, and extension.  No midline tenderness, step-offs, or crepitus noted.  No paraspinous muscle tenderness to palpation.  Paraspinous muscles appear tight on the left.  Negative Spurling's bilaterally  SHOULDERS: Left shoulder: Symmetrical compared to the contralateral side, no focal swelling.  No focal tenderness to palpation.  Intact active range of motion in all planes.  5/5 strength in all muscle distributions.  Negative Hawking's, O'Brien's, empty can, Neer's, and speeds tests.  Right shoulder: Symmetrical compared to the contralateral side, no focal swelling.  No focal tenderness to palpation.  Intact active range of motion in all planes.  5/5 strength in all muscle distributions.  Negative Hawking's, O'Brien's, empty can, Neer's, and speeds tests.  ELBOWS: Left elbow: Symmetrical compared to the contralateral side, no outward signs of trauma or focal swelling.  No focal tenderness to palpation.  Intact active range of motion with flexion, extension, pronation, and supination.  5/5 biceps and triceps strength.   Right elbow: Symmetrical compared to the contralateral side, no outward signs of trauma or focal swelling.  No focal tenderness to palpation.  Intact active range of motion with flexion, extension,  pronation, and supination.  5/5 biceps and triceps strength.  NEURO: sensation intact to light touch.  DTR 2/4 bicep, tricep, brachial radialis bilaterally VASC: pulses 2+ and symmetric bilaterally, no edema    Radiology: Limited MSK ultrasound completed 06/06/2022 by Dr. Artist Lloyd personally reviewed and interpreted by me today showing: - No significant abnormalities, no signs of tearing, otherwise unremarkable shoulder ultrasound  Left shoulder x-ray 06/25/2020 personally reviewed and interpreted by me today showing: - No acute bony abnormalities  Assessment & Plan Left cervical radiculopathy Patient presenting with intermittent pulling sensation to the left triceps with accompanying neck and upper back tightness.   Physical exam demonstrates pan-normal bilateral shoulder and bilateral elbow exams.  Cervical exam demonstrating tightness in the paraspinous muscles, but no midline abnormalities.  Negative Spurling's bilaterally.  Given the patient's description of his pain with accompanying cervical symptoms exacerbated by certain neck movements and coughing/laughing suspect cervical radiculopathy.  Differential additionally includes injury to the triceps muscle belly, however less likely given no reproducible tenderness on palpation or other significant findings while evaluating the tricep.    Plan: - Imaging: During the visit discussed obtaining cervical spine x-ray to further evaluate for underlying degenerative changes.  After the visit patient reached out inquiring about MRI.  Will instead obtain MRI cervical spine without contrast to rule out pinched nerve or herniated disc or other abnormality - Rx Mobic  15 mg p.o. daily x 1 to 2 weeks with food, then as needed thereafter - Rx Flexeril  5 mg half tab p.o. nightly, is extremely sleepy/groggy if taking more than 2.5 mg - Home exercise program provided for the neck and the shoulder - Follow-up after imaging to review results and further  treatment.  Could consider formal physical therapy or further imaging   Ulnar neuropathy at elbow, unspecified laterality Patient presenting with paresthesias to the 4th and 5th finger bilaterally that occur during sleep with elbow flexion.  Physical exam demonstrating no strength deficits or ongoing neurological deficits.  Given the patient's description of pain and presents with elbow flexion suspect ulnar neuropathy/cubital tunnel syndrome.  Given no changes in strength or persistent neurological symptoms we will plan to pursue conservative measures such as elbow extension bracing as at night.  Plan: - Elbow extension bracing at night - Rx Mobic  15 mg p.o. daily x 1 to 2 weeks with food, then as needed thereafter.  This should help with cubital tunnel symptoms as well - Could consider referral to physical therapy if symptoms worsening  Patient seen and examined with resident Dr. Robert Gallivan, MD PGY3 Patient expressed understanding & agreement with above.  Encounter Diagnoses  Name Primary?   Left cervical radiculopathy Yes   Ulnar neuropathy at elbow, unspecified laterality     Orders Placed This Encounter  Procedures   DG Cervical Spine 2 or 3 views

## 2023-09-27 ENCOUNTER — Ambulatory Visit
Admission: RE | Admit: 2023-09-27 | Discharge: 2023-09-27 | Disposition: A | Discharge status: Home / Self Care | Source: Ambulatory Visit | Attending: Family Medicine | Admitting: Family Medicine

## 2023-09-27 ENCOUNTER — Encounter: Payer: Self-pay | Admitting: Family Medicine

## 2023-09-27 DIAGNOSIS — M5412 Radiculopathy, cervical region: Secondary | ICD-10-CM | POA: Diagnosis not present

## 2023-09-28 ENCOUNTER — Other Ambulatory Visit

## 2023-10-02 ENCOUNTER — Ambulatory Visit: Payer: Self-pay | Admitting: Family Medicine

## 2023-11-03 ENCOUNTER — Ambulatory Visit: Admitting: Physician Assistant

## 2023-11-03 ENCOUNTER — Other Ambulatory Visit (HOSPITAL_COMMUNITY): Payer: Self-pay

## 2023-11-03 ENCOUNTER — Encounter: Payer: Self-pay | Admitting: Physician Assistant

## 2023-11-03 VITALS — BP 118/70 | HR 70 | Temp 98.4°F | Ht 66.0 in | Wt 169.0 lb

## 2023-11-03 DIAGNOSIS — K644 Residual hemorrhoidal skin tags: Secondary | ICD-10-CM | POA: Diagnosis not present

## 2023-11-03 MED ORDER — HYDROCORTISONE (PERIANAL) 2.5 % EX CREA
1.0000 | TOPICAL_CREAM | Freq: Two times a day (BID) | CUTANEOUS | 1 refills | Status: AC
  Filled 2023-11-03: qty 30, 10d supply, fill #0

## 2023-11-03 NOTE — Progress Notes (Signed)
 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   MRN: [id number]   Assessment & Plan:  External hemorrhoids without complication -     Hydrocortisone  (Perianal); Place 1 Application rectally 2 (two) times daily.  Dispense: 30 g; Refill: 1     Assessment & Plan External hemorrhoid without complication Experiencing discomfort from a larger external non-thrombosed hemorrhoid for the past three days, with pain, difficulty getting comfortable, and trouble sleeping. Over-the-counter treatments have not provided relief. The hemorrhoid is not currently bleeding. - Prescribe Anusol  HC rectal cream to apply twice daily. - Advise to avoid straining. - Recommend using a stool softener daily for the next week. - Suggest warm sitz baths. - Instruct to keep the area dry and clean. - Advise to follow up with GI if symptoms worsen or do not improve.      Return if symptoms worsen or fail to improve.    Subjective:    Chief Complaint  Patient presents with   Hemorrhoids    Pt in office and c/o hemorrhoid that has been bothering him; noticed it Tuesday and was having pain, having trouble getting comfortable and sleeping; using OTC remedies, Preperation H, cortisone creme, Ibuprofen,      HPI Discussed the use of AI scribe software for clinical note transcription with the patient, who gave verbal consent to proceed.  History of Present Illness He is a 38 year old male who presents with hemorrhoid pain.  He has been experiencing significant discomfort from a hemorrhoid for the past three days. The pain is severe enough to interfere with his ability to find a comfortable position and is disrupting his sleep.  He has been using over-the-counter treatments including Preparation H, cortisone cream, and ibuprofen, but these have provided minimal relief.  He weight lifts regularly.      Past Medical History:  Diagnosis Date   Asthma    Hypertension     Past  Surgical History:  Procedure Laterality Date   KNEE ARTHROSCOPY     meniscal tear - ju jitsu injury    Family History  Problem Relation Age of Onset   Hypertension Mother    Lupus Mother    Other Father        Pre-Diabetes   Diabetes Maternal Grandmother    Hypertension Maternal Grandmother    Stroke Maternal Grandmother    COPD Maternal Grandfather     Social History   Tobacco Use   Smoking status: Never   Smokeless tobacco: Never  Substance Use Topics   Alcohol use: Yes    Comment: Occasional   Drug use: Never     No Known Allergies  Review of Systems NEGATIVE UNLESS OTHERWISE INDICATED IN HPI      Objective:     BP 118/70 (BP Location: Left Arm, Patient Position: Sitting, Cuff Size: Normal)   Pulse 70   Temp 98.4 F (36.9 C) (Temporal)   Ht 5' 6 (1.676 m)   Wt 169 lb (76.7 kg)   SpO2 100%   BMI 27.28 kg/m   Wt Readings from Last 3 Encounters:  11/03/23 169 lb (76.7 kg)  09/26/23 160 lb (72.6 kg)  06/07/23 173 lb 6.4 oz (78.7 kg)    BP Readings from Last 3 Encounters:  11/03/23 118/70  09/26/23 126/82  06/07/23 119/75     Physical Exam Vitals and nursing note reviewed. Exam conducted with a chaperone present.  Constitutional:      Appearance:  Normal appearance.  Genitourinary:    Rectum: External hemorrhoid (non-thrombosed, non-bleeding) present. No anal fissure.  Neurological:     Mental Status: He is alert.             Marlyne Totaro M Amaliya Whitelaw, PA-C

## 2023-11-22 ENCOUNTER — Encounter: Payer: Self-pay | Admitting: Family Medicine

## 2023-11-22 ENCOUNTER — Ambulatory Visit (INDEPENDENT_AMBULATORY_CARE_PROVIDER_SITE_OTHER): Admitting: Family Medicine

## 2023-11-22 ENCOUNTER — Other Ambulatory Visit (HOSPITAL_COMMUNITY): Payer: Self-pay

## 2023-11-22 ENCOUNTER — Other Ambulatory Visit (HOSPITAL_COMMUNITY)
Admission: RE | Admit: 2023-11-22 | Discharge: 2023-11-22 | Disposition: A | Discharge status: Home / Self Care | Source: Ambulatory Visit | Attending: Family Medicine | Admitting: Family Medicine

## 2023-11-22 VITALS — BP 114/70 | HR 52 | Temp 97.2°F | Ht 66.0 in | Wt 166.6 lb

## 2023-11-22 DIAGNOSIS — Z113 Encounter for screening for infections with a predominantly sexual mode of transmission: Secondary | ICD-10-CM | POA: Insufficient documentation

## 2023-11-22 DIAGNOSIS — Z0001 Encounter for general adult medical examination with abnormal findings: Secondary | ICD-10-CM | POA: Insufficient documentation

## 2023-11-22 DIAGNOSIS — E785 Hyperlipidemia, unspecified: Secondary | ICD-10-CM

## 2023-11-22 DIAGNOSIS — Z1159 Encounter for screening for other viral diseases: Secondary | ICD-10-CM | POA: Diagnosis not present

## 2023-11-22 DIAGNOSIS — R739 Hyperglycemia, unspecified: Secondary | ICD-10-CM

## 2023-11-22 DIAGNOSIS — Z114 Encounter for screening for human immunodeficiency virus [HIV]: Secondary | ICD-10-CM | POA: Diagnosis not present

## 2023-11-22 DIAGNOSIS — I10 Essential (primary) hypertension: Secondary | ICD-10-CM

## 2023-11-22 DIAGNOSIS — J4520 Mild intermittent asthma, uncomplicated: Secondary | ICD-10-CM

## 2023-11-22 LAB — COMPREHENSIVE METABOLIC PANEL WITH GFR
ALT: 19 U/L (ref 0–53)
AST: 32 U/L (ref 0–37)
Albumin: 4 g/dL (ref 3.5–5.2)
Alkaline Phosphatase: 54 U/L (ref 39–117)
BUN: 15 mg/dL (ref 6–23)
CO2: 28 meq/L (ref 19–32)
Calcium: 8.5 mg/dL (ref 8.4–10.5)
Chloride: 105 meq/L (ref 96–112)
Creatinine, Ser: 0.98 mg/dL (ref 0.40–1.50)
GFR: 98.28 mL/min (ref >60.00)
Glucose, Bld: 81 mg/dL (ref 70–99)
Potassium: 4 meq/L (ref 3.5–5.1)
Sodium: 139 meq/L (ref 135–145)
Total Bilirubin: 0.5 mg/dL (ref 0.2–1.2)
Total Protein: 6.7 g/dL (ref 6.0–8.3)

## 2023-11-22 LAB — CBC
HCT: 42.4 % (ref 39.0–52.0)
Hemoglobin: 13.6 g/dL (ref 13.0–17.0)
MCHC: 32 g/dL (ref 30.0–36.0)
MCV: 87.8 fl (ref 78.0–100.0)
Platelets: 251 K/uL (ref 150.0–400.0)
RBC: 4.83 Mil/uL (ref 4.22–5.81)
RDW: 13.8 % (ref 11.5–15.5)
WBC: 3 K/uL — ABNORMAL LOW (ref 4.0–10.5)

## 2023-11-22 LAB — LIPID PANEL
Cholesterol: 126 mg/dL (ref 0–200)
HDL: 44.2 mg/dL (ref >39.00)
LDL Cholesterol: 74 mg/dL (ref 0–99)
NonHDL: 81.4
Total CHOL/HDL Ratio: 3
Triglycerides: 37 mg/dL (ref 0.0–149.0)
VLDL: 7.4 mg/dL (ref 0.0–40.0)

## 2023-11-22 LAB — TSH: TSH: 2.19 u[IU]/mL (ref 0.35–5.50)

## 2023-11-22 LAB — HEMOGLOBIN A1C: Hgb A1c MFr Bld: 5.6 % (ref 4.6–6.5)

## 2023-11-22 MED ORDER — ALBUTEROL SULFATE HFA 108 (90 BASE) MCG/ACT IN AERS
2.0000 | INHALATION_SPRAY | RESPIRATORY_TRACT | 3 refills | Status: AC | PRN
  Filled 2023-11-22: qty 6.7, 16d supply, fill #0

## 2023-11-22 NOTE — Assessment & Plan Note (Signed)
 Stable.  Will refill albuterol .  Uses this very sporadically as needed.

## 2023-11-22 NOTE — Progress Notes (Signed)
 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    Assessment/Plan:  New/Acute Problems: Hemorrhoids Improving with conservative measures.  He will let us  know if he needs any further assistance with this.  Chronic Problems Addressed Today: Dyslipidemia Check labs.  Lipid panel is notable for LDL of 149 last.  He has been increasing his fiber intake and is now taking Metamucil.  Recheck lipids today.  Essential hypertension At goal today without medications.   Mild intermittent asthma without complication Stable.  Will refill albuterol .  Uses this very sporadically as needed.  Preventative Healthcare: Check labs.  Up-to-date on flu vaccine.  Deferred pneumonia vaccine.  Check sexual transmitted infection panel per patient request.  Patient Counseling(The following topics were reviewed and/or handout was given):  -Nutrition: Stressed importance of moderation in sodium/caffeine intake, saturated fat and cholesterol, caloric balance, sufficient intake of fresh fruits, vegetables, and fiber.  -Stressed the importance of regular exercise.   -Substance Abuse: Discussed cessation/primary prevention of tobacco, alcohol, or other drug use; driving or other dangerous activities under the influence; availability of treatment for abuse.   -Injury prevention: Discussed safety belts, safety helmets, smoke detector, smoking near bedding or upholstery.   -Sexuality: Discussed sexually transmitted diseases, partner selection, use of condoms, avoidance of unintended pregnancy and contraceptive alternatives.   -Dental health: Discussed importance of regular tooth brushing, flossing, and dental visits.  -Health maintenance and immunizations reviewed. Please refer to Health maintenance section.  Return to care in 1 year for next preventative visit.     Subjective:  HPI:  He has no acute complaints today. Patient is here today  for his annual physical.  See assessment / plan for status of chronic conditions.  Discussed the use of AI scribe software for clinical note transcription with the patient, who gave verbal consent to proceed.  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  He experienced a recent episode of hemorrhoid discomfort, peaking two days before the visit, causing significant discomfort, sleep disturbance, and a day off work. He uses Metamucil daily, which he believes may increase bowel movement frequency. Bowel movements are sometimes frequent and soft, with occasional constipation but no significant straining. He ensures adequate hydration and fiber intake.  He has a history of asthma, using an albuterol  inhaler approximately once a year, most recently after exposure to hot air and dishwasher detergent.  He mentions occasional overuse injuries, including a recent thumb issue managed with anti-inflammatory medication. He has previously been prescribed Mobic  for similar issues.  He is concerned about cholesterol levels, noting a previous increase in LDL. Since April, he has been taking Metamucil daily and reduced egg consumption. He engages in regular physical activity, including weight training and walking, and tries to maintain a healthy diet.  He is up to date with his flu vaccination and has discussed the pneumonia vaccine due to his asthma.     Lifestyle Diet: Balanced.  Cutting down on eggs due to high cholesterol. Exercise: Tries to stay active on weeks that he is not working.  Does both cardio and weight training.     11/22/2023    7:43 AM  Depression screen PHQ 2/9  Decreased Interest 0  Down, Depressed, Hopeless 0  PHQ - 2 Score 0    There are no preventive care reminders to display for this patient.   ROS:  Per HPI, otherwise a complete review of systems was negative.   PMH:  The following were reviewed and entered/updated  in epic: Past Medical History:  Diagnosis Date   Asthma    Hypertension    Patient Active Problem List   Diagnosis Date Noted   Dyslipidemia 06/07/2023   Paresthesia 10/27/2021   Migraine 03/10/2020   Mild intermittent asthma without complication 08/09/2017   Bucket handle tear of medial meniscus of right knee 06/29/2015   Essential hypertension 10/22/2013   Past Surgical History:  Procedure Laterality Date   KNEE ARTHROSCOPY     meniscal tear - ju jitsu injury    Family History  Problem Relation Age of Onset   Hypertension Mother    Lupus Mother    Other Father        Pre-Diabetes   Diabetes Maternal Grandmother    Hypertension Maternal Grandmother    Stroke Maternal Grandmother    COPD Maternal Grandfather     Medications- reviewed and updated Current Outpatient Medications  Medication Sig Dispense Refill   hydrocortisone  (ANUSOL -HC) 2.5 % rectal cream Place 1 Application rectally 2 (two) times daily. 30 g 1   albuterol  (VENTOLIN  HFA) 108 (90 Base) MCG/ACT inhaler Inhale 2 puffs into the lungs every 4 (four) hours as needed for wheezing or shortness of breath. 6.7 g 3   No current facility-administered medications for this visit.    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[employer]      Right Handed    Lives in one story home    Social Drivers of Health   Financial Resource Strain: Low Risk  (11/18/2023)   Overall  Financial Resource Strain (CARDIA)    Difficulty of Paying Living Expenses: Not hard at all  Food Insecurity: No Food Insecurity (11/18/2023)   Hunger Vital Sign    Worried About Running Out of Food in the Last Year: Never true    Ran Out of Food in the Last Year: Never true  Transportation Needs: No Transportation Needs (11/18/2023)   PRAPARE - Administrator, Civil Service (Medical): No    Lack of Transportation (Non-Medical): No  Physical Activity: Sufficiently Active (11/18/2023)   Exercise Vital Sign    Days of Exercise per Week: 5 days    Minutes of Exercise per Session: 60 min  Stress: No Stress Concern Present (11/18/2023)   [employer] of Occupational Health - Occupational Stress Questionnaire    Feeling of Stress: Only a little  Social Connections: Moderately Isolated (11/18/2023)   Social Connection and Isolation Panel    Frequency of Communication with Friends and Family: Once a week    Frequency of Social Gatherings with Friends and Family: Once a week    Attends Religious Services: More than 4 times per year    Active Member of [employer] or Organizations: Yes  Attends Engineer, structural: More than 4 times per year    Marital Status: Never married        Objective:  Physical Exam: BP 114/70   Pulse (!) 52   Temp (!) 97.2 F (36.2 C) (Temporal)   Ht 5' 6 (1.676 m)   Wt 166 lb 9.6 oz (75.6 kg)   SpO2 100%   BMI 26.89 kg/m   Body mass index is 26.89 kg/m. Wt Readings from Last 3 Encounters:  11/22/23 166 lb 9.6 oz (75.6 kg)  11/03/23 169 lb (76.7 kg)  09/26/23 160 lb (72.6 kg)   Gen: NAD, resting comfortably HEENT: TMs normal bilaterally. OP clear. No thyromegaly noted.  CV: RRR with no murmurs appreciated Pulm: NWOB, CTAB with no crackles, wheezes, or rhonchi GI: Normal bowel sounds present. Soft, Nontender, Nondistended. MSK: no edema, cyanosis, or clubbing noted Skin: warm, dry Neuro: CN2-12 grossly intact. Strength 5/5 in upper  and lower extremities. Reflexes symmetric and intact bilaterally.  Psych: Normal affect and thought content     Clodagh Odenthal M. Kennyth, MD 11/22/2023 8:13 AM

## 2023-11-22 NOTE — Assessment & Plan Note (Signed)
 At goal today without medications.

## 2023-11-22 NOTE — Assessment & Plan Note (Signed)
 Check labs.  Lipid panel is notable for LDL of 149 last.  He has been increasing his fiber intake and is now taking Metamucil.  Recheck lipids today.

## 2023-11-22 NOTE — Patient Instructions (Signed)
 It was very nice to see you today!  VISIT SUMMARY: You came in for your annual physical exam. We discussed your recent hemorrhoid discomfort, asthma management, cholesterol concerns, and overall wellness. Routine blood work and STI screening were ordered.  YOUR PLAN: ADULT WELLNESS VISIT: Routine wellness visit with no major health concerns since the last appointment. -A physical examination, including heart and lung check, was performed. -Routine blood work was ordered, including CBC, complete metabolic panel, A1c, TSH, and lipid panel. -STI screening was ordered, covering HIV, RPR, hepatitis C, and a urine test for chlamydia and gonorrhea. -Continue maintaining a healthy lifestyle, including diet and exercise.  DYSLIPIDEMIA: Your cholesterol levels are being managed with Metamucil and dietary changes. -A lipid panel was ordered. -Continue taking Metamucil daily. -Maintain dietary modifications and regular exercise.  MILD INTERMITTENT ASTHMA: You have mild intermittent asthma with rare use of albuterol . -A prescription for an albuterol  inhaler was sent.  HEMORRHOIDS, RESOLVING: Your hemorrhoids are resolving, with a small skin tag and improved initial discomfort. -Continue using Metamucil. -You may use Colace or Miralax if needed for constipation. -Monitor for any changes or worsening symptoms.  Return in about 1 year (around 11/21/2024) for Annual Physical.   Take care, Dr Kennyth  PLEASE NOTE:  If you had any lab tests, please let us  know if you have not heard back within a few days. You may see your results on mychart before we have a chance to review them but we will give you a call once they are reviewed by us .   If we ordered any referrals today, please let us  know if you have not heard from their office within the next week.   If you had any urgent prescriptions sent in today, please check with the pharmacy within an hour of our visit to make sure the prescription was  transmitted appropriately.   Please try these tips to maintain a healthy lifestyle:  Eat at least 3 REAL meals and 1-2 snacks per day.  Aim for no more than 5 hours between eating.  If you eat breakfast, please do so within one hour of getting up.   Each meal should contain half fruits/vegetables, one quarter protein, and one quarter carbs (no bigger than a computer mouse)  Cut down on sweet beverages. This includes juice, soda, and sweet tea.   Drink at least 1 glass of water with each meal and aim for at least 8 glasses per day  Exercise at least 150 minutes every week.    Preventive Care 27-16 Years Old, Male Preventive care refers to lifestyle choices and visits with your health care provider that can promote health and wellness. Preventive care visits are also called wellness exams. What can I expect for my preventive care visit? Counseling During your preventive care visit, your health care provider may ask about your: Medical history, including: Past medical problems. Family medical history. Current health, including: Emotional well-being. Home life and relationship well-being. Sexual activity. Lifestyle, including: Alcohol, nicotine or tobacco, and drug use. Access to firearms. Diet, exercise, and sleep habits. Safety issues such as seatbelt and bike helmet use. Sunscreen use. Work and work Astronomer. Physical exam Your health care provider may check your: Height and weight. These may be used to calculate your BMI (body mass index). BMI is a measurement that tells if you are at a healthy weight. Waist circumference. This measures the distance around your waistline. This measurement also tells if you are at a healthy weight and may help  predict your risk of certain diseases, such as type 2 diabetes and high blood pressure. Heart rate and blood pressure. Body temperature. Skin for abnormal spots. What immunizations do I need?  Vaccines are usually given at various  ages, according to a schedule. Your health care provider will recommend vaccines for you based on your age, medical history, and lifestyle or other factors, such as travel or where you work. What tests do I need? Screening Your health care provider may recommend screening tests for certain conditions. This may include: Lipid and cholesterol levels. Diabetes screening. This is done by checking your blood sugar (glucose) after you have not eaten for a while (fasting). Hepatitis B test. Hepatitis C test. HIV (human immunodeficiency virus) test. STI (sexually transmitted infection) testing, if you are at risk. Talk with your health care provider about your test results, treatment options, and if necessary, the need for more tests. Follow these instructions at home: Eating and drinking  Eat a healthy diet that includes fresh fruits and vegetables, whole grains, lean protein, and low-fat dairy products. Drink enough fluid to keep your urine pale yellow. Take vitamin and mineral supplements as recommended by your health care provider. Do not drink alcohol if your health care provider tells you not to drink. If you drink alcohol: Limit how much you have to 0-2 drinks a day. Know how much alcohol is in your drink. In the [place] one drink equals one 12 oz bottle of beer (355 mL), one 5 oz glass of wine (148 mL), or one 1 oz glass of hard liquor (44 mL). Lifestyle Brush your teeth every morning and night with fluoride toothpaste. Floss one time each day. Exercise for at least 30 minutes 5 or more days each week. Do not use any products that contain nicotine or tobacco. These products include cigarettes, chewing tobacco, and vaping devices, such as e-cigarettes. If you need help quitting, ask your health care provider. Do not use drugs. If you are sexually active, practice safe sex. Use a condom or other form of protection to prevent STIs. Find healthy ways to manage stress, such as: Meditation,  yoga, or listening to music. Journaling. Talking to a trusted person. Spending time with friends and family. Minimize exposure to UV radiation to reduce your risk of skin cancer. Safety Always wear your seat belt while driving or riding in a vehicle. Do not drive: If you have been drinking alcohol. Do not ride with someone who has been drinking. If you have been using any mind-altering substances or drugs. While texting. When you are tired or distracted. Wear a helmet and other protective equipment during sports activities. If you have firearms in your house, make sure you follow all gun safety procedures. Seek help if you have been physically or sexually abused. What's next? Go to your health care provider once a year for an annual wellness visit. Ask your health care provider how often you should have your eyes and teeth checked. Stay up to date on all vaccines. This information is not intended to replace advice given to you by your health care provider. Make sure you discuss any questions you have with your health care provider. Document Revised: 07/29/2020 Document Reviewed: 07/29/2020 Elsevier Patient Education  2024 [employer].

## 2023-11-23 LAB — URINE CYTOLOGY ANCILLARY ONLY
Chlamydia: NEGATIVE
Comment: NEGATIVE
Comment: NORMAL
Neisseria Gonorrhea: NEGATIVE

## 2023-11-23 LAB — HEPATITIS C ANTIBODY: Hepatitis C Ab: NONREACTIVE

## 2023-11-23 LAB — RPR: RPR Ser Ql: NONREACTIVE

## 2023-11-23 LAB — HIV ANTIBODY (ROUTINE TESTING W REFLEX)
HIV 1&2 Ab, 4th Generation: NONREACTIVE
HIV FINAL INTERPRETATION: NEGATIVE

## 2023-11-27 ENCOUNTER — Ambulatory Visit: Payer: Self-pay | Admitting: Family Medicine

## 2023-11-27 NOTE — Progress Notes (Signed)
 Cholesterol is much better!  No need to start statin at this point.  He should continue with lifestyle changes and we can recheck again in a year.  His white blood cell count is borderline low but stable to the last few years and everything else is normal.  We can recheck everything in a year.

## 2023-12-02 ENCOUNTER — Other Ambulatory Visit (HOSPITAL_COMMUNITY): Payer: Self-pay

## 2024-03-12 ENCOUNTER — Other Ambulatory Visit (HOSPITAL_COMMUNITY): Payer: Self-pay

## 2024-03-12 ENCOUNTER — Ambulatory Visit: Admitting: Family Medicine

## 2024-03-12 ENCOUNTER — Encounter: Payer: Self-pay | Admitting: Family Medicine

## 2024-03-12 VITALS — BP 130/84 | HR 69 | Ht 66.0 in | Wt 179.0 lb

## 2024-03-12 DIAGNOSIS — M545 Low back pain, unspecified: Secondary | ICD-10-CM

## 2024-03-12 DIAGNOSIS — G8929 Other chronic pain: Secondary | ICD-10-CM

## 2024-03-12 MED ORDER — PREDNISONE 50 MG PO TABS
50.0000 mg | ORAL_TABLET | Freq: Every day | ORAL | 0 refills | Status: AC
  Filled 2024-03-12: qty 5, 5d supply, fill #0

## 2024-03-12 NOTE — Patient Instructions (Addendum)
 Thank you for coming in today.   Let us  know if you'd like for us  to place an order for a X-Ray of your back.   Let us  know if you'd like for us  to place a referral for formal physical therapy.   See you back as needed.

## 2024-03-12 NOTE — Progress Notes (Signed)
"       ° °  I, Leotis Batter, CMA acting as a scribe for Artist Lloyd, MD.  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 Pt was previously seen by Dr .Lloyd on 09/26/23 for cervical radiculopathy.   Today, pt c/o exacerbation of LBP. Pt locates pain to bilateral lower back, L>R. Notes that n/t is worse with prolonged sitting, improves with standing; prolonged standing causes worsening back pain. Denies groin pain. Notes n/t in the inner L thigh, gluteal region, and 1-2 L toes. Sx wax and wane, improves the most while lying down.   Radiating pain: L LE LE numbness/tingling: inner L thigh, 1-2 left toes.  LE weakness: denies Aggravates: prolonged sitting Treatments tried: Mobic , Flexeril , Prednisone   Pertinent review of systems: No fevers or Gelsyn  Relevant historical information: Had a workup at neurosurgery in 2023 with a lumbar spine MRI that ultimately was normal.   Exam:  BP 130/84   Pulse 69   Ht 5' 6 (1.676 m)   Wt 179 lb (81.2 kg)   SpO2 99%   BMI 28.89 kg/m  General: Well Developed, well nourished, and in no acute distress.   MSK: L-spine: Normal-appearing Nontender palpation spinal midline.  Tender palpation right lumbar paraspinal musculature. Normal lumbar motion lower extremity strength reflexes and sensation are intact.    Lab and Radiology Results No results found for this or any previous visit (from the past 72 hours). No results found.     Assessment and Plan: 39 y.o. male with intermittent recurrent chronic low back pain occurring with some paresthesia mostly involving the left leg and some right.  Paresthesias involve the medial toes and medial thigh and do not correspond to a single nerve root.  Plan for some basic conservative management today with home exercise program heating pad and intermittent use of oral meloxicam  and cyclobenzaprine  which she does have already have.  I did refill prednisone   that he can use if needed.  Recommended PT Pilates as a DIY approach.  We could refer to physical therapy if needed and proceed with further imaging.  Could update lumbar spine x-ray and ultimately MRI if needed.  He will keep me updated with how he feels and we can proceed with more if needed in the future.   PDMP not reviewed this encounter. No orders of the defined types were placed in this encounter.  Meds ordered this encounter  Medications   predniSONE  (DELTASONE ) 50 MG tablet    Sig: Take 1 tablet (50 mg total) by mouth daily.    Dispense:  5 tablet    Refill:  0     Discussed warning signs or symptoms. Please see discharge instructions. Patient expresses understanding.   The above documentation has been reviewed and is accurate and complete Artist Lloyd, M.D.   "

## 2024-03-13 ENCOUNTER — Other Ambulatory Visit (HOSPITAL_COMMUNITY): Payer: Self-pay

## 2024-03-13 ENCOUNTER — Ambulatory Visit: Admitting: Family Medicine
